# Patient Record
Sex: Male | Born: 1940 | Race: White | Hispanic: No | Marital: Married | Smoking: Former smoker
Health system: Southern US, Community
[De-identification: ages and names within clinical notes are randomized; demographics above are authoritative.]

## PROBLEM LIST (undated history)

## (undated) DIAGNOSIS — N400 Enlarged prostate without lower urinary tract symptoms: Secondary | ICD-10-CM

## (undated) DIAGNOSIS — E785 Hyperlipidemia, unspecified: Secondary | ICD-10-CM

## (undated) DIAGNOSIS — G4730 Sleep apnea, unspecified: Secondary | ICD-10-CM

## (undated) DIAGNOSIS — J309 Allergic rhinitis, unspecified: Secondary | ICD-10-CM

## (undated) HISTORY — DX: Allergic rhinitis, unspecified: J30.9

## (undated) HISTORY — DX: Benign prostatic hyperplasia without lower urinary tract symptoms: N40.0

## (undated) HISTORY — PX: COLONOSCOPY: SHX174

## (undated) HISTORY — DX: Sleep apnea, unspecified: G47.30

## (undated) HISTORY — DX: Hyperlipidemia, unspecified: E78.5

---

## 1997-12-10 HISTORY — PX: PROSTATE BIOPSY: SHX241

## 1999-05-22 ENCOUNTER — Other Ambulatory Visit: Admission: RE | Admit: 1999-05-22 | Discharge: 1999-05-22 | Payer: Self-pay | Admitting: Urology

## 1999-12-11 HISTORY — PX: TOTAL KNEE ARTHROPLASTY: SHX125

## 2000-04-25 ENCOUNTER — Emergency Department (HOSPITAL_COMMUNITY): Admission: EM | Admit: 2000-04-25 | Discharge: 2000-04-25 | Payer: Self-pay | Admitting: Emergency Medicine

## 2000-06-17 ENCOUNTER — Encounter: Payer: Self-pay | Admitting: Orthopedic Surgery

## 2000-06-26 ENCOUNTER — Inpatient Hospital Stay (HOSPITAL_COMMUNITY): Admission: RE | Admit: 2000-06-26 | Discharge: 2000-07-01 | Payer: Self-pay | Admitting: Orthopedic Surgery

## 2000-06-28 ENCOUNTER — Encounter: Payer: Self-pay | Admitting: Orthopedic Surgery

## 2004-07-31 ENCOUNTER — Encounter: Payer: Self-pay | Admitting: Internal Medicine

## 2005-02-28 ENCOUNTER — Ambulatory Visit: Payer: Self-pay | Admitting: Internal Medicine

## 2005-05-18 ENCOUNTER — Ambulatory Visit: Payer: Self-pay | Admitting: Internal Medicine

## 2005-07-23 ENCOUNTER — Ambulatory Visit: Payer: Self-pay | Admitting: Internal Medicine

## 2006-01-24 ENCOUNTER — Ambulatory Visit: Payer: Self-pay | Admitting: Internal Medicine

## 2006-02-07 ENCOUNTER — Ambulatory Visit: Payer: Self-pay | Admitting: Internal Medicine

## 2006-02-12 ENCOUNTER — Encounter: Payer: Self-pay | Admitting: Internal Medicine

## 2006-02-12 ENCOUNTER — Ambulatory Visit (HOSPITAL_BASED_OUTPATIENT_CLINIC_OR_DEPARTMENT_OTHER): Admission: RE | Admit: 2006-02-12 | Discharge: 2006-02-12 | Payer: Self-pay | Admitting: Internal Medicine

## 2006-02-12 ENCOUNTER — Encounter: Payer: Self-pay | Admitting: Pulmonary Disease

## 2006-02-26 ENCOUNTER — Ambulatory Visit: Payer: Self-pay | Admitting: Pulmonary Disease

## 2006-08-07 ENCOUNTER — Ambulatory Visit: Payer: Self-pay | Admitting: Internal Medicine

## 2007-10-31 ENCOUNTER — Ambulatory Visit: Payer: Self-pay | Admitting: Family Medicine

## 2007-11-24 ENCOUNTER — Ambulatory Visit: Payer: Self-pay | Admitting: Internal Medicine

## 2007-11-24 LAB — CONVERTED CEMR LAB
ALT: 16 units/L (ref 0–53)
Basophils Relative: 0.3 % (ref 0.0–1.0)
Bilirubin, Direct: 0.2 mg/dL (ref 0.0–0.3)
CO2: 31 meq/L (ref 19–32)
Calcium: 9.3 mg/dL (ref 8.4–10.5)
Creatinine, Ser: 1 mg/dL (ref 0.4–1.5)
Eosinophils Relative: 4.2 % (ref 0.0–5.0)
GFR calc Af Amer: 97 mL/min
Glucose, Bld: 97 mg/dL (ref 70–99)
Hemoglobin: 13.7 g/dL (ref 13.0–17.0)
Lymphocytes Relative: 26.9 % (ref 12.0–46.0)
Monocytes Absolute: 0.5 10*3/uL (ref 0.2–0.7)
Neutro Abs: 3.7 10*3/uL (ref 1.4–7.7)
Nitrite: NEGATIVE
Potassium: 4.4 meq/L (ref 3.5–5.1)
RDW: 13.1 % (ref 11.5–14.6)
TSH: 2.41 microintl units/mL (ref 0.35–5.50)
Total Bilirubin: 0.8 mg/dL (ref 0.3–1.2)
Total Protein: 6 g/dL (ref 6.0–8.3)
Urobilinogen, UA: NEGATIVE
VLDL: 17 mg/dL (ref 0–40)
WBC Urine, dipstick: NEGATIVE
WBC: 6.1 10*3/uL (ref 4.5–10.5)

## 2007-11-26 ENCOUNTER — Ambulatory Visit: Payer: Self-pay | Admitting: Internal Medicine

## 2007-11-26 DIAGNOSIS — Z87898 Personal history of other specified conditions: Secondary | ICD-10-CM | POA: Insufficient documentation

## 2007-11-26 DIAGNOSIS — J309 Allergic rhinitis, unspecified: Secondary | ICD-10-CM | POA: Insufficient documentation

## 2007-11-26 DIAGNOSIS — Z96659 Presence of unspecified artificial knee joint: Secondary | ICD-10-CM | POA: Insufficient documentation

## 2007-12-08 ENCOUNTER — Ambulatory Visit: Payer: Self-pay | Admitting: Internal Medicine

## 2007-12-12 ENCOUNTER — Encounter (INDEPENDENT_AMBULATORY_CARE_PROVIDER_SITE_OTHER): Payer: Self-pay | Admitting: *Deleted

## 2008-01-08 ENCOUNTER — Ambulatory Visit: Payer: Self-pay | Admitting: Internal Medicine

## 2008-01-21 ENCOUNTER — Encounter (INDEPENDENT_AMBULATORY_CARE_PROVIDER_SITE_OTHER): Payer: Self-pay | Admitting: *Deleted

## 2008-01-21 LAB — CONVERTED CEMR LAB
Albumin: 3.6 g/dL (ref 3.5–5.2)
Alkaline Phosphatase: 47 units/L (ref 39–117)
Total Bilirubin: 1.1 mg/dL (ref 0.3–1.2)
Total CHOL/HDL Ratio: 4.4
Total CK: 69 units/L (ref 7–195)
Triglycerides: 73 mg/dL (ref 0–149)
VLDL: 15 mg/dL (ref 0–40)

## 2008-02-24 ENCOUNTER — Ambulatory Visit: Payer: Self-pay | Admitting: Internal Medicine

## 2008-02-24 DIAGNOSIS — E785 Hyperlipidemia, unspecified: Secondary | ICD-10-CM | POA: Insufficient documentation

## 2008-02-24 LAB — CONVERTED CEMR LAB
Cholesterol, target level: 200 mg/dL
HDL goal, serum: 40 mg/dL
LDL Goal: 160 mg/dL

## 2008-05-25 ENCOUNTER — Ambulatory Visit: Payer: Self-pay | Admitting: Internal Medicine

## 2008-06-01 ENCOUNTER — Encounter: Payer: Self-pay | Admitting: Internal Medicine

## 2008-06-06 LAB — CONVERTED CEMR LAB
ALT: 15 units/L (ref 0–53)
AST: 18 units/L (ref 0–37)
Albumin: 3.7 g/dL (ref 3.5–5.2)
Alkaline Phosphatase: 52 units/L (ref 39–117)
Bilirubin, Direct: 0.1 mg/dL (ref 0.0–0.3)
Cholesterol: 178 mg/dL (ref 0–200)
Total Protein: 6.6 g/dL (ref 6.0–8.3)
Triglycerides: 79 mg/dL (ref 0–149)

## 2008-06-08 ENCOUNTER — Encounter (INDEPENDENT_AMBULATORY_CARE_PROVIDER_SITE_OTHER): Payer: Self-pay | Admitting: *Deleted

## 2008-07-15 ENCOUNTER — Telehealth (INDEPENDENT_AMBULATORY_CARE_PROVIDER_SITE_OTHER): Payer: Self-pay | Admitting: *Deleted

## 2008-07-16 ENCOUNTER — Ambulatory Visit: Payer: Self-pay | Admitting: Internal Medicine

## 2008-07-19 ENCOUNTER — Encounter (INDEPENDENT_AMBULATORY_CARE_PROVIDER_SITE_OTHER): Payer: Self-pay | Admitting: *Deleted

## 2008-07-19 LAB — CONVERTED CEMR LAB
AST: 24 units/L (ref 0–37)
Albumin: 3.9 g/dL (ref 3.5–5.2)
Alkaline Phosphatase: 53 units/L (ref 39–117)
Basophils Absolute: 0 10*3/uL (ref 0.0–0.1)
Basophils Relative: 0.6 % (ref 0.0–3.0)
Eosinophils Absolute: 0.1 10*3/uL (ref 0.0–0.7)
Lymphocytes Relative: 27 % (ref 12.0–46.0)
MCHC: 33.7 g/dL (ref 30.0–36.0)
MCV: 91.6 fL (ref 78.0–100.0)
Neutrophils Relative %: 56.5 % (ref 43.0–77.0)
RBC: 4.73 M/uL (ref 4.22–5.81)
RDW: 13.1 % (ref 11.5–14.6)
TSH: 2.38 microintl units/mL (ref 0.35–5.50)
Total Bilirubin: 1 mg/dL (ref 0.3–1.2)
Total CK: 130 units/L (ref 7–195)

## 2008-07-25 LAB — CONVERTED CEMR LAB: Vit D, 1,25-Dihydroxy: 30 (ref 30–89)

## 2008-07-26 ENCOUNTER — Encounter (INDEPENDENT_AMBULATORY_CARE_PROVIDER_SITE_OTHER): Payer: Self-pay | Admitting: *Deleted

## 2008-10-13 ENCOUNTER — Ambulatory Visit: Payer: Self-pay | Admitting: Family Medicine

## 2008-10-15 ENCOUNTER — Telehealth (INDEPENDENT_AMBULATORY_CARE_PROVIDER_SITE_OTHER): Payer: Self-pay | Admitting: *Deleted

## 2008-10-16 ENCOUNTER — Ambulatory Visit: Payer: Self-pay | Admitting: Family Medicine

## 2008-10-27 ENCOUNTER — Encounter (INDEPENDENT_AMBULATORY_CARE_PROVIDER_SITE_OTHER): Payer: Self-pay | Admitting: *Deleted

## 2008-12-22 ENCOUNTER — Encounter: Payer: Self-pay | Admitting: Internal Medicine

## 2008-12-26 ENCOUNTER — Encounter: Payer: Self-pay | Admitting: Internal Medicine

## 2008-12-27 ENCOUNTER — Telehealth (INDEPENDENT_AMBULATORY_CARE_PROVIDER_SITE_OTHER): Payer: Self-pay | Admitting: *Deleted

## 2008-12-29 ENCOUNTER — Telehealth (INDEPENDENT_AMBULATORY_CARE_PROVIDER_SITE_OTHER): Payer: Self-pay | Admitting: *Deleted

## 2009-01-04 ENCOUNTER — Ambulatory Visit: Payer: Self-pay | Admitting: Pulmonary Disease

## 2009-01-26 ENCOUNTER — Encounter: Payer: Self-pay | Admitting: Pulmonary Disease

## 2009-02-15 ENCOUNTER — Telehealth (INDEPENDENT_AMBULATORY_CARE_PROVIDER_SITE_OTHER): Payer: Self-pay | Admitting: *Deleted

## 2009-02-16 ENCOUNTER — Encounter: Payer: Self-pay | Admitting: Pulmonary Disease

## 2009-05-13 ENCOUNTER — Ambulatory Visit: Payer: Self-pay | Admitting: Family Medicine

## 2009-05-25 ENCOUNTER — Ambulatory Visit: Payer: Self-pay | Admitting: Internal Medicine

## 2009-06-01 ENCOUNTER — Telehealth (INDEPENDENT_AMBULATORY_CARE_PROVIDER_SITE_OTHER): Payer: Self-pay | Admitting: *Deleted

## 2009-06-01 ENCOUNTER — Encounter (INDEPENDENT_AMBULATORY_CARE_PROVIDER_SITE_OTHER): Payer: Self-pay | Admitting: *Deleted

## 2009-06-28 ENCOUNTER — Encounter: Payer: Self-pay | Admitting: Internal Medicine

## 2009-07-11 ENCOUNTER — Ambulatory Visit: Payer: Self-pay | Admitting: Pulmonary Disease

## 2009-08-16 ENCOUNTER — Ambulatory Visit: Payer: Self-pay | Admitting: Internal Medicine

## 2009-08-21 LAB — CONVERTED CEMR LAB
Cholesterol: 166 mg/dL (ref 0–200)
LDL Cholesterol: 110 mg/dL — ABNORMAL HIGH (ref 0–99)

## 2009-08-22 ENCOUNTER — Encounter (INDEPENDENT_AMBULATORY_CARE_PROVIDER_SITE_OTHER): Payer: Self-pay | Admitting: *Deleted

## 2009-09-13 ENCOUNTER — Ambulatory Visit: Payer: Self-pay | Admitting: Internal Medicine

## 2009-12-29 ENCOUNTER — Encounter: Payer: Self-pay | Admitting: Pulmonary Disease

## 2010-06-20 ENCOUNTER — Ambulatory Visit: Payer: Self-pay | Admitting: Internal Medicine

## 2010-06-20 DIAGNOSIS — R079 Chest pain, unspecified: Secondary | ICD-10-CM | POA: Insufficient documentation

## 2010-06-20 DIAGNOSIS — D179 Benign lipomatous neoplasm, unspecified: Secondary | ICD-10-CM | POA: Insufficient documentation

## 2010-06-21 ENCOUNTER — Encounter: Payer: Self-pay | Admitting: Internal Medicine

## 2010-06-27 ENCOUNTER — Ambulatory Visit: Payer: Self-pay | Admitting: Internal Medicine

## 2010-07-10 ENCOUNTER — Ambulatory Visit: Payer: Self-pay | Admitting: Pulmonary Disease

## 2010-07-10 DIAGNOSIS — G4733 Obstructive sleep apnea (adult) (pediatric): Secondary | ICD-10-CM | POA: Insufficient documentation

## 2010-07-11 ENCOUNTER — Ambulatory Visit: Payer: Self-pay | Admitting: Internal Medicine

## 2010-07-21 ENCOUNTER — Ambulatory Visit: Payer: Self-pay

## 2010-07-21 ENCOUNTER — Ambulatory Visit: Payer: Self-pay | Admitting: Cardiology

## 2010-10-30 ENCOUNTER — Encounter: Payer: Self-pay | Admitting: Internal Medicine

## 2010-11-02 ENCOUNTER — Encounter: Payer: Self-pay | Admitting: Pulmonary Disease

## 2010-11-25 ENCOUNTER — Emergency Department (HOSPITAL_COMMUNITY)
Admission: EM | Admit: 2010-11-25 | Discharge: 2010-11-26 | Payer: Self-pay | Source: Home / Self Care | Admitting: Emergency Medicine

## 2010-11-27 ENCOUNTER — Telehealth: Payer: Self-pay | Admitting: Internal Medicine

## 2010-11-27 DIAGNOSIS — K828 Other specified diseases of gallbladder: Secondary | ICD-10-CM | POA: Insufficient documentation

## 2010-11-27 DIAGNOSIS — R109 Unspecified abdominal pain: Secondary | ICD-10-CM | POA: Insufficient documentation

## 2010-12-06 ENCOUNTER — Ambulatory Visit (HOSPITAL_COMMUNITY)
Admission: RE | Admit: 2010-12-06 | Discharge: 2010-12-06 | Payer: Self-pay | Source: Home / Self Care | Attending: Internal Medicine | Admitting: Internal Medicine

## 2010-12-07 ENCOUNTER — Telehealth (INDEPENDENT_AMBULATORY_CARE_PROVIDER_SITE_OTHER): Payer: Self-pay | Admitting: *Deleted

## 2010-12-10 HISTORY — PX: CHOLECYSTECTOMY: SHX55

## 2010-12-25 ENCOUNTER — Encounter: Payer: Self-pay | Admitting: Internal Medicine

## 2011-01-02 ENCOUNTER — Telehealth (INDEPENDENT_AMBULATORY_CARE_PROVIDER_SITE_OTHER): Payer: Self-pay | Admitting: *Deleted

## 2011-01-03 LAB — COMPREHENSIVE METABOLIC PANEL
ALT: 16 U/L (ref 0–53)
Alkaline Phosphatase: 55 U/L (ref 39–117)
CO2: 29 mEq/L (ref 19–32)
Calcium: 9.5 mg/dL (ref 8.4–10.5)
GFR calc non Af Amer: >60 mL/min (ref >60)
Glucose, Bld: 90 mg/dL (ref 70–99)
Potassium: 4.8 mEq/L (ref 3.5–5.1)
Sodium: 142 mEq/L (ref 135–145)
Total Bilirubin: 1.1 mg/dL (ref 0.3–1.2)

## 2011-01-03 LAB — CBC
HCT: 44.9 % (ref 39.0–52.0)
MCHC: 33.9 g/dL (ref 30.0–36.0)
MCV: 90 fL (ref 78.0–100.0)
RDW: 13.2 % (ref 11.5–15.5)
WBC: 7.6 10*3/uL (ref 4.0–10.5)

## 2011-01-03 LAB — DIFFERENTIAL
Eosinophils Relative: 3 % (ref 0–5)
Lymphocytes Relative: 23 % (ref 12–46)
Lymphs Abs: 1.8 10*3/uL (ref 0.7–4.0)
Monocytes Absolute: 0.7 10*3/uL (ref 0.1–1.0)
Monocytes Relative: 9 % (ref 3–12)

## 2011-01-03 LAB — SURGICAL PCR SCREEN: Staphylococcus aureus: POSITIVE — AB

## 2011-01-03 LAB — URINALYSIS, ROUTINE W REFLEX MICROSCOPIC
Bilirubin Urine: NEGATIVE
Hgb urine dipstick: NEGATIVE
Specific Gravity, Urine: 1.01 (ref 1.005–1.030)
Urobilinogen, UA: 0.2 mg/dL (ref 0.0–1.0)

## 2011-01-07 LAB — CONVERTED CEMR LAB
Albumin: 3.7 g/dL (ref 3.5–5.2)
Alkaline Phosphatase: 52 units/L (ref 39–117)
BUN: 16 mg/dL (ref 6–23)
Basophils Absolute: 0 10*3/uL (ref 0.0–0.1)
Basophils Absolute: 0 10*3/uL (ref 0.0–0.1)
Bilirubin Urine: NEGATIVE
Bilirubin, Direct: 0.2 mg/dL (ref 0.0–0.3)
Blood in Urine, dipstick: NEGATIVE
CO2: 29 meq/L (ref 19–32)
CO2: 32 meq/L (ref 19–32)
Calcium: 9.3 mg/dL (ref 8.4–10.5)
Cholesterol: 211 mg/dL — ABNORMAL HIGH (ref 0–200)
Creatinine, Ser: 0.9 mg/dL (ref 0.4–1.5)
Direct LDL: 137.7 mg/dL
Direct LDL: 139 mg/dL
Eosinophils Absolute: 0.1 10*3/uL (ref 0.0–0.7)
Eosinophils Absolute: 0.1 10*3/uL (ref 0.0–0.7)
Glucose, Bld: 80 mg/dL (ref 70–99)
Glucose, Bld: 90 mg/dL (ref 70–99)
HCT: 39.8 % (ref 39.0–52.0)
HDL: 53.5 mg/dL (ref >39.00)
Hemoglobin: 13.9 g/dL (ref 13.0–17.0)
LDL Goal: 85 mg/dL
Lymphocytes Relative: 29.8 % (ref 12.0–46.0)
Lymphs Abs: 1.5 10*3/uL (ref 0.7–4.0)
MCHC: 34.2 g/dL (ref 30.0–36.0)
MCHC: 34.9 g/dL (ref 30.0–36.0)
Monocytes Absolute: 0.7 10*3/uL (ref 0.1–1.0)
Neutro Abs: 7.5 10*3/uL (ref 1.4–7.7)
Neutrophils Relative %: 58.1 % (ref 43.0–77.0)
PSA: 2.17 ng/mL (ref 0.10–4.00)
Platelets: 172 10*3/uL (ref 150.0–400.0)
Potassium: 3.7 meq/L (ref 3.5–5.1)
RBC: 4.53 M/uL (ref 4.22–5.81)
RDW: 13.3 % (ref 11.5–14.6)
RDW: 14.4 % (ref 11.5–14.6)
Sodium: 144 meq/L (ref 135–145)
TSH: 1.36 microintl units/mL (ref 0.35–5.50)
Total Bilirubin: 1 mg/dL (ref 0.3–1.2)
Total CHOL/HDL Ratio: 4
Triglycerides: 71 mg/dL (ref 0.0–149.0)
Triglycerides: 92 mg/dL (ref 0.0–149.0)
Urobilinogen, UA: 0.2

## 2011-01-09 ENCOUNTER — Ambulatory Visit (HOSPITAL_COMMUNITY)
Admission: RE | Admit: 2011-01-09 | Discharge: 2011-01-10 | Disposition: A | Discharge status: Home / Self Care | Payer: Medicare Other | Attending: General Surgery | Admitting: General Surgery

## 2011-01-09 ENCOUNTER — Other Ambulatory Visit: Payer: Self-pay | Admitting: General Surgery

## 2011-01-09 DIAGNOSIS — Z23 Encounter for immunization: Secondary | ICD-10-CM | POA: Insufficient documentation

## 2011-01-09 DIAGNOSIS — K811 Chronic cholecystitis: Secondary | ICD-10-CM | POA: Insufficient documentation

## 2011-01-09 DIAGNOSIS — E785 Hyperlipidemia, unspecified: Secondary | ICD-10-CM | POA: Insufficient documentation

## 2011-01-09 DIAGNOSIS — Z96659 Presence of unspecified artificial knee joint: Secondary | ICD-10-CM | POA: Insufficient documentation

## 2011-01-09 DIAGNOSIS — Z79899 Other long term (current) drug therapy: Secondary | ICD-10-CM | POA: Insufficient documentation

## 2011-01-09 DIAGNOSIS — G4733 Obstructive sleep apnea (adult) (pediatric): Secondary | ICD-10-CM | POA: Insufficient documentation

## 2011-01-09 NOTE — Letter (Signed)
Summary: [facility]  [facility]   Imported By: Lanelle Bal 11/10/2010 09:52:42  _____________________________________________________________________  External Attachment:    Type:   Image     Comment:   External Document

## 2011-01-09 NOTE — Letter (Signed)
Summary: CMN for CPAP Supplies/[facility]  CMN for CPAP Supplies/[facility]   Imported By: Sherian Rein 11/10/2010 08:22:37  _____________________________________________________________________  External Attachment:    Type:   Image     Comment:   External Document

## 2011-01-09 NOTE — Letter (Signed)
Summary: [facility]  [facility]   Imported By: Lanelle Bal 07/06/2010 13:24:19  _____________________________________________________________________  External Attachment:    Type:   Image     Comment:   External Document

## 2011-01-09 NOTE — Assessment & Plan Note (Signed)
Summary: shingles vac/cbs  Nurse Visit   Allergies: 1)  ! Crestor (Rosuvastatin Calcium)  Immunizations Administered:  Zostavax # 1:    Vaccine Type: Zostavax    Site: Right Arm    Mfr: Merck    Dose: 0.82mL    Route: [place]    Given by: Shonna Chock CMA    Exp. Date: 07/27/2011    Lot #: [id number]    VIS given: 09/21/05 given July 11, 2010.  Orders Added: 1)  Zoster (Shingles) Vaccine Live [90736] 2)  Admin 1st Vaccine [phone]

## 2011-01-09 NOTE — Letter (Signed)
Summary: CMN for CPAP Supplies/[facility]  CMN for CPAP Supplies/[facility]   Imported By: Sherian Rein 01/05/2010 08:23:32  _____________________________________________________________________  External Attachment:    Type:   Image     Comment:   External Document

## 2011-01-09 NOTE — Assessment & Plan Note (Signed)
Summary: PNEUMONIA SHOT/CDJ  Nurse Visit   Allergies: 1)  ! Crestor (Rosuvastatin Calcium)  Immunizations Administered:  Pneumonia Vaccine:    Vaccine Type: Pneumovax (Medicare)    Site: left deltoid    Mfr: Merck    Dose: 0.5 ml    Route: IM    Exp. Date: 12/27/2011    Lot #: [id number]  Orders Added: 1)  Pneumococcal Vaccine [90732] 2)  Admin 1st Vaccine [87564]

## 2011-01-09 NOTE — Op Note (Signed)
[name], [name] NO.:  [id number]  MEDICAL RECORD NO.:  [id number]          PATIENT TYPE:  OIB  LOCATION:  0098                         FACILITY:  [facility]  PHYSICIAN:  Angelia Mould. Derrell Lolling, M.D.DATE OF BIRTH:  04/17/1941  DATE OF PROCEDURE:  01/09/2011 DATE OF DISCHARGE:                              OPERATIVE REPORT   PREOPERATIVE DIAGNOSIS:  Chronic cholecystitis.  POSTOPERATIVE DIAGNOSIS:  Chronic cholecystitis.  OPERATION PERFORMED:  Laparoscopic cholecystectomy with intraoperative cholangiogram.  SURGEON:  Claud Kelp, M.D.  FIRST ASSISTANT:  Anselm Pancoast. Zachery Dakins, M.D.  OPERATIVE INDICATIONS:  This is a 70 year old Caucasian gentleman with sleep apnea, hyperlipidemia, and benign prostatic hypertrophy.  For the last few months, he had been having some episodes of reflux and bloating easily after he eats a heavy meal.  In December, he had an episode of epigastric and right upper quadrant pain radiating to the right flank with mild nausea, and he went to the emergency room.  Cardiac disease was ruled out.  Lab work was normal.  Ultrasound showed tiny immobile foci, considerations being polyps or adenomyosis.  He had a subsequent biliary scan which showed a depressed ejection fraction of 16%. Subsequent to this, he only has minor discomfort.  He was referred to me for consideration of cholecystectomy.  It is notable that his father had gallstones as well.  I felt that his symptom complex was fairly consistent with biliary tract disease, and although it was unclear whether he really had stones or not, he was offered the option of cholecystectomy.  He decided to go ahead and have that done, and he is brought to the operating room electively.  OPERATIVE FINDINGS:  The gallbladder was thin-walled and was of a fairly normal color.  There were extensive chronic adhesions to the lower body and infundibulum which took at least 10-15 minutes to take  down. This suggested prior inflammatory episodes.  The cholangiogram was normal showing normal intrahepatic and extrahepatic biliary anatomy, a long cystic duct, no filling defects, and no obstruction with good flow of contrast into the duodenum.  The stomach, duodenum, large intestine, and small intestine all looked normal to inspection.  OPERATIVE TECHNIQUE:  Following the induction of general endotracheal anesthesia, the patient's abdomen was prepped and draped in a sterile fashion.  Surgical time-out was held identifying the correct patient and correct procedure.  Intravenous antibiotics were given.  The 0.5% Marcaine with epinephrine was used as local infiltration anesthetic.  A transverse curved incision was made at the superior rim of the umbilicus.  The fascia was incised in the midline and the abdominal cavity entered under direct vision.  An 11-mm Hassan trocar was inserted and secured with a pursestring suture of 0 Vicryl.  Pneumoperitoneum was created.  Video cam was inserted with findings as described above.  An 11-mm trocar was placed in the subxiphoid region and two 5-mm trocars were placed in the right upper quadrant.  The gallbladder fundus was identified and elevated.  We spent about 10 minutes taking down all the adhesions to the lower body of the gallbladder and the infundibulum.  We slowly dissected out the  cystic duct and the cystic artery.  We actually found the cystic artery branched into an anterior and posterior branch. We spent some time dissecting that out and securing them with metal clips and dividing them as they went onto the gallbladder.  This helped open up a large window and critical view behind the cystic duct. A cholangiogram catheter was inserted into the cystic duct, and a cholangiogram was obtained using the C-arm.  The cholangiogram was normal as described above.  We then removed the cholangiogram catheter, secured the cystic duct with 3 or 4  metal clips and divided it.  The gallbladder dissected from its bed with electrocautery, placed in the specimen bag and removed.  The operative field was irrigated with saline.  Hemostasis was excellent and achieved electrocautery.  We removed all the irrigation fluid.  Then we closed the pursestring suture of 0 Vicryl at the umbilicus.  There was still a little bit of blood dripping down into the peritoneal cavity coming from the umbilical closure.  We then used an Endo Close device and placed a figure-of-eight suture of 0 Vicryl and then tied that down into the fascia and that stopped the bleeding.  This was observed for 5 minutes, and there is no more bleeding.  We evacuated the small amount of blood  that had dripped off of the umbilicus onto the omentum.  The right upper quadrant trocars and epigastric trocars were removed and there was no bleeding from the trocars.  All the skin incisions were closed with subcuticular sutures of 4-0 Monocryl and Dermabond.  Clean bandages were placed, and the patient taken to recovery in stable condition.  Estimated blood loss was about 20 mL. Complications none.  Sponge, needles, and instrument counts werecorrect.     Angelia Mould. Derrell Lolling, M.D.     HMI/MEDQ  D:  01/09/2011  T:  01/09/2011  Job:  [id number]  cc:   Titus Dubin. Alwyn Ren, MD,FACP,FCCP [id number] [place] [address]  Electronically Signed by Claud Kelp M.D. on 01/09/2011 05:51:33 PM
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
## 2011-01-09 NOTE — Assessment & Plan Note (Signed)
Summary: rov for osa   Primary Provider/Referring Provider:  Dr Marga Melnick  CC:  Pt is here for a 1 yr f/u appt on his OSA.  Pt states he is wearing his cpap macine every night.  Approx 7 hours per night.  Pt would like to discuss getting new cpap machine.  Pt denied any complaints with mask or pressure.  Marland Kitchen  History of Present Illness: The pt comes in today for f/u of his osa.  He is doing well with the device, and wears compliantly.  He has no issues with mask fit or pressure.  He feels he is sleeping well, and is satisfied with his daytime alertness.  His weight is up 3 pounds since the last visit.  He is asking about getting a new machine, and will have to check on the age thru his dme.  Medications Prior to Update: 1)  Allegra 180 Mg  Tabs (Fexofenadine Hcl) .... Take 1 Tablet By Mouth Once A Day As Needed 2)  Allergy Vaccine .... Once Weekly 3)  Cpap .Marland Kitchen.. 10 Cwp 4)  Welchol 3.75 Gm Pack (Colesevelam Hcl) .Marland Kitchen.. 1 Pack Daily in 8 Oz Water @ Mealtime 5)  Zetia 10 Mg Tabs (Ezetimibe) .Marland Kitchen.. 1 Once Daily With Eve Mael.  Allergies (verified): 1)  ! Crestor (Rosuvastatin Calcium)  Review of Systems  The patient denies shortness of breath with activity, shortness of breath at rest, productive cough, non-productive cough, coughing up blood, chest pain, irregular heartbeats, acid heartburn, indigestion, loss of appetite, weight change, abdominal pain, difficulty swallowing, sore throat, tooth/dental problems, headaches, nasal congestion/difficulty breathing through nose, sneezing, itching, ear ache, anxiety, depression, hand/feet swelling, joint stiffness or pain, rash, change in color of mucus, and fever.    Vital Signs:  Patient profile:   70 year old male Height:      71.5 inches Weight:      200.38 pounds BMI:     27.66 O2 Sat:      98 % on Room air Temp:     98.2 degrees F oral Pulse rate:   66 / minute BP sitting:   112 / 68  (right arm) Cuff size:   regular  Vitals Entered By:  Arman Filter LPN (July 10, 2010 10:10 AM)  O2 Flow:  Room air CC: Pt is here for a 1 yr f/u appt on his OSA.  Pt states he is wearing his cpap macine every night.  Approx 7 hours per night.  Pt would like to discuss getting new cpap machine.  Pt denied any complaints with mask or pressure.   Comments Medications reviewed with patient Arman Filter LPN  July 10, 2010 10:12 AM    Physical Exam  General:  wd male in nad Nose:  no skin breakdown or pressure necrosis from the cpap mask Extremities:  no edema or cyanosis Neurologic:  alert and oriented, not sleepy, moves all 4.   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  Will check with dme about getting him a new cpap machine, and I have reminded him to keep up with supplies and mask exchanges.  I have also asked him to work on modest weight loss.  Other Orders: Est. Patient Level III (16109) DME Referral (DME)  Patient Instructions: 1)  will see about getting you a new cpap machine 2)  followup with me  in one year.

## 2011-01-11 NOTE — Progress Notes (Signed)
Summary: Records Request  Faxed Stress to [facility] at [facility] ([phone]). Debby Freiberg  January 02, 2011 2:42 PM

## 2011-01-11 NOTE — Progress Notes (Signed)
Summary: gallbladder polyps  Phone Note Call from Patient Call back at Home Phone [phone] Call back at [phone] or [phone]   Caller: Spouse Summary of Call: Patient spouse lm on triage that over the weekend the pt went to the ER b/c he thought he was having a heart attack. It turns out that he was not, but did have gallbladder polyps. Would you like to see the pt for hospital follow up or prefer to refer the pt to [facility]. Please advise. Initial call taken by: Lucious Groves CMA,  November 27, 2010 11:48 AM  Follow-up for Phone Call        I recommend Papida scan to assess GB function then OV Follow-up by: Marga Melnick MD,  November 27, 2010 2:03 PM  Additional Follow-up for Phone Call Additional follow up Details #1::        Discuss with patient appt scheduled..........Marland KitchenFelecia Deloach CMA  November 27, 2010 2:38 PM   New Problems: ABDOMINAL PAIN, UNSPECIFIED SITE (ICD-789.00) POLYP, GALLBLADDER (ICD-575.8)   New Problems: ABDOMINAL PAIN, UNSPECIFIED SITE (ICD-789.00) POLYP, GALLBLADDER (ICD-575.8)

## 2011-01-11 NOTE — Progress Notes (Signed)
Summary: Request for Pain/Nausea Meds  Phone Note Call from Patient   Caller: Patient Call For: Marga Melnick MD Details for Reason: Requesting meds Summary of Call: Call from patient and he stated he is having gallbladder surgery in a few weeks, but is having some pain and nausea sparadically. He wants to know if we could call in something for his pain and nausea to [facility] in the [place]. He is currently not having any issues but wants to be prepared due to the holidays. Allergic to Crestor only per pt summary. c/b # [phone].Marland KitchenMarland KitchenMarland Kitchen please advise Initial call taken by: Almeta Monas CMA Duncan Dull),  December 07, 2010 12:56 PM  Follow-up for Phone Call        per Dr. Alwyn Ren ok to call in Tramadol 50 mg every 6 hours #30 and phenergan 25mg  every 6-8 hours as needed nausea #5...Marland KitchenMarland KitchenMarland Kitchen Pt aware and not allergic Follow-up by: Almeta Monas CMA Duncan Dull),  December 07, 2010 2:39 PM    New/Updated Medications: TRAMADOL HCL 50 MG TABS (TRAMADOL HCL) 1 by mouth every 6 hours as needed PROMETHAZINE HCL 25 MG TABS (PROMETHAZINE HCL) by mouth every 6-8 hours as needed nausea Prescriptions: PROMETHAZINE HCL 25 MG TABS (PROMETHAZINE HCL) by mouth every 6-8 hours as needed nausea  #5 x 0   Entered by:   Almeta Monas CMA (AAMA)   Authorized by:   Marga Melnick MD   Signed by:   Almeta Monas CMA (AAMA) on 12/07/2010   Method used:   Faxed to ...       [facility]. [phone]* (retail)       [phone]       [place], [place]  [address]       Ph: [phone]       Fax: [phone]   RxID:   [id number] TRAMADOL HCL 50 MG TABS (TRAMADOL HCL) 1 by mouth every 6 hours as needed  #30 x 0   Entered by:   Almeta Monas CMA (AAMA)   Authorized by:   Marga Melnick MD   Signed by:   Almeta Monas CMA Duncan Dull) on 12/07/2010   Method used:   Electronically to        [facility]. [phone]* (retail)       [address]  [place], [place]  [address]       Ph: [phone]       Fax: [phone]   RxID:   [id number]

## 2011-01-17 NOTE — Consult Note (Signed)
Summary: [facility]   Imported By: Lanelle Bal 01/10/2011 10:57:11  _____________________________________________________________________  External Attachment:    Type:   Image     Comment:   External Document

## 2011-01-22 ENCOUNTER — Encounter: Payer: Self-pay | Admitting: Internal Medicine

## 2011-02-06 NOTE — Letter (Signed)
Summary: [facility]   Imported By: Maryln Gottron 02/01/2011 08:39:23  _____________________________________________________________________  External Attachment:    Type:   Image     Comment:   External Document

## 2011-02-19 LAB — COMPREHENSIVE METABOLIC PANEL
ALT: 17 U/L (ref 0–53)
Alkaline Phosphatase: 54 U/L (ref 39–117)
CO2: 27 mEq/L (ref 19–32)
Glucose, Bld: 91 mg/dL (ref 70–99)
Potassium: 3.9 mEq/L (ref 3.5–5.1)
Sodium: 135 mEq/L (ref 135–145)
Total Protein: 6.6 g/dL (ref 6.0–8.3)

## 2011-02-19 LAB — POCT CARDIAC MARKERS
CKMB, poc: 2.3 ng/mL (ref 1.0–8.0)
Myoglobin, poc: 56.3 ng/mL (ref 12–200)
Troponin i, poc: <0.05 ng/mL (ref 0.00–0.09)

## 2011-02-19 LAB — DIFFERENTIAL
Basophils Relative: 1 % (ref 0–1)
Eosinophils Absolute: 0.4 10*3/uL (ref 0.0–0.7)
Eosinophils Relative: 6 % — ABNORMAL HIGH (ref 0–5)
Monocytes Relative: 8 % (ref 3–12)
Neutrophils Relative %: 55 % (ref 43–77)

## 2011-02-19 LAB — CBC
HCT: 41.3 % (ref 39.0–52.0)
Hemoglobin: 13.9 g/dL (ref 13.0–17.0)
WBC: 7.3 10*3/uL (ref 4.0–10.5)

## 2011-04-27 NOTE — Discharge Summary (Signed)
[facility]  Patient:    [name], [name]                      MRN: [id number] Adm. Date:  [id number] Disc. Date: [id number] Attending:  Loanne Drilling Dictator:   Della Goo, P.A.                           Discharge Summary  ADMISSION DIAGNOSIS:  Severe tricompartmental arthritis, left knee.  DISCHARGE DIAGNOSES: 1. Severe tricompartmental arthritis, left knee. 2. Postoperative urinary tract infection, treated with oral antibiotics. 3. Mild post-hemorrhagic anemia. 4. Possible early consolidation in right lower lobe of the lung per    postoperative chest x-ray. 5. Postoperative fevers, resolving at discharge.  PROCEDURE:  On June 26, 2000, patient underwent left total knee arthroplasty performed by Dr. Ollen Gross, assisted by Druscilla Brownie. Shela Nevin, P.A., under spinal anesthesia.  CONSULTANTS:  None.  BRIEF HISTORY:  Patient is a 70 year old white male with severe osteoarthritis of the left knee, with bone-on-bone deformity and varus deformity.  He has had pain refractory to nonoperative management and was felt to require surgical intervention.  He was admitted for the procedure as stated above.  BRIEF HOSPITAL COURSE:  Patient tolerated the procedure without difficulties. Postoperatively, neurovascular and motor function of the lower extremities were noted to be intact after the spinal anesthesia had resolved.  Hemovac drain was removed on the first postoperative day and dressing changes were done daily thereafter.  Patient had no drainage of his wound.  He was noted to have mild-to-moderate edema of the wound with minimal erythema.  The edema of the knee was at its greatest on postop day #4 and his CPM machine was held with strict ice and elevation to the knee.  Following 24 hours of this treatment, the edema was significantly decreased and patient had range of motion actively from 0 to 90 degrees.  Patient tolerated physical therapy  well for ambulation and gait training and was allowed weightbearing as tolerated on the operative extremity.  He was ambulating distances as much as 200 feet. Patient received occupational therapy for ADLs and tolerated this without difficulty.  He did have continued spikes in his temperature throughout the hospital stay and a urinalysis did show a urinary tract infection.  Patient was started on Cipro empirically during the hospital stay.  He also had a repeat chest x-ray postoperatively showing a right lower lobe consolidation. Blood cultures were obtained secondary to spike in fever and were negative for growth.  At that time of discharge, patients antibiotic was changed to Levaquin to decrease the risk of elevated pro times, which can happen with Cipro; also, this was felt to be more suitable to cover any problems with his lungs.  He was also treated with incentive spirometry and coughing and deep breathing during the hospital stay and will continue this at home.  Patient was on Coumadin for DVT and PE prophylaxis throughout the hospital stay, with adjustments in his daily doses, monitored and changed by the pharmacist at [facility].  LABORATORY AND X-RAY FINDINGS:  Pertinent laboratory values include hemoglobin and hematocrit on admission to be 14.4 and 42.2, respectively.  Patients hemoglobin dropped to the lowest value of 11.9 with hematocrit 36.1 and no need for blood transfusion.  Coagulation studies were normal on admission and pro time and INR were elevated while on Coumadin appropriately.  Patients urinalysis on June 28, 2000 showed amber color, cloudy appearance, small hemoglobin, small leukocyte esterase, a few epithelial cells, 11-20 rbcs and wbcs, a few bacteria, hyaline casts and calcium oxalate crystals.  Chest x-ray on admission revealed heart to be normal in size and pulmonary vascularity felt to be within normal limits.  Markings were noted to be somewhat  prominent in the bases; however, no active inflammatory changes noted.  Appearance of some calcification in the right paratracheal area suggestive of old granulomatous changes, with final impression of negative chest for active disease.  A repeat chest x-ray on July 20. 2001 showed possible early right lower lobe consolidation.  Other possibility when evaluating the prior study is that there may be a vascular abnormality in the right lower lobe, such as an abnormal draining vein.  Enlarged hilus could be simple artifact related to technique and rotation.  EKG on admission showed normal sinus rhythm, confirmed by cardiologist.  PLAN:  Patient is being discharged to his home with arrangements for home health physical therapy three times per week.  He will continue to have active and passive range of motion as well as strengthening exercises.  He is allowed weightbearing as tolerated with ambulation and will use a walker.  Dressing change will be done daily at home by patient and his family.  He will be allowed to shower.  Patient will resume a regular diet.  He will follow up with Dr. Lequita Halt two weeks from the date of his surgery.  If patient has any further pulmonary symptoms or if fever continues, they have been advised to call the office.  At that time, a repeat chest x-ray will be performed.  He will use his incentive spirometry at home.  Patient has TEDs bilaterally which he will use at home as well.  DISCHARGE MEDICATIONS: 1. Percocet 5 mg, #30, one to two every four to six hours as needed for pain. 2. Robaxin 500 mg, #20, one every eight hours as needed for spasm. 3. Coumadin per pharmacy. 4. Levaquin 500 mg one p.o. q.d. x 7 days.  SPECIAL INSTRUCTIONS:  Pro times will be drawn weekly, with adjustments in dosage made by the pharmacy at [facility].  Patient has been advised to continue ice packs on the knee as needed and especially after physical therapy.  Patient  and/or family will call the office if he has spikes in his fever or any other complications for which they are concerned. DD:  07/01/00 TD:  07/03/00 Job: [id number]

## 2011-04-27 NOTE — H&P (Signed)
[facility]  Patient:    [name], [name]                      MRN: [id number] Adm. Date:  [id number] Attending:  Loanne Drilling Dictator:   Druscilla Brownie Shela Nevin, P.A.                         History and Physical  CHIEF COMPLAINT:  "Pain in my left knee."  HISTORY OF PRESENT ILLNESS:  This 70 year old white male has had continuing progressive pain in his left knee.  The patient has had progressive problems with the knee and this has overall interfered with his day-to-day activity as well as his work activities.  He works at the [employer] and has difficulty doing his daily activities due to the pain in his knee.  He also has extreme difficulty in climbing stairs.  He has had to cut short many of his activities do to the pain in his left knee.  Examination of the left knee shows that he has developed a 5 degree varus deformity with crepitus throughout the entire range of motion.  This is markedly uncomfortable.  He has more tenderness in the medial knee than the lateral.  X-rays of the left knee shows changes that are consistent with severe osteoarthritis with bone-on-bone deformity.  Neurovascular is intact in the lower extremity.  Due to these facts above as well as his failure to respond to conservative care which has included ______ anti-inflammatories.  It was felt that this patient would benefit from surgical intervention with a total knee replacement arthroplasty of the left knee.  PAST MEDICAL HISTORY:  This gentleman really has been in good health throughout his life time.  Dr. Clearance Coots has seen the patient preoperatively and cleared him for surgery.  CURRENT MEDICATIONS:  Vioxx, allergy shots and Claritin.  ALLERGIES:  Tetanus (30 years ago).  He has had recent tetanus shots with no problems.  SOCIAL HISTORY:  The patient neither smokes nor drinks.  He is employed at [employer].  PHYSICAL EXAMINATION:   Noncontributory.  REVIEW OF SYSTEMS:  CNS:  No seizure disorder, paralysis, numbness, or double vision.  RESPIRATORY:  No productive cough, no hemoptysis, no shortness of breath. CARDIOVASCULAR:  No chest pain, no angina, no orthopnea.  GI:  No nausea, vomiting, melanotic stool.  GU:  No discharge, dysuria, or hematuria. MUSCULOSKELETAL:  Primarily as in present illness with his left knee.  PHYSICAL EXAMINATION:  Alert and cooperative, 70 year old, friendly, white male.  VITAL SIGNS:  Blood pressure 108/68, respirations 12, pulse 72.  HEENT:  Normocephalic.  PERLA.  Oropharynx is clear.  CHEST:  Clear to auscultation.  No rhonchi, no rales.  HEART:  Regular rate and rhythm.  No murmurs are heard.  ABDOMEN:  Soft, nontender.  Liver and spleen not felt.  GENITALIA AND RECTAL:  Not done.  Not pertinent to present illness.  EXTREMITIES:  Left knee as in present illness above.  ADMITTING DIAGNOSIS:  Severe tricompartmental arthritis, left knee.  PLAN:  The patient will undergo left knee total replacement arthroplasty.  No blood has been donated to this procedure.  He will not need rehabilitation after his surgery.  He will be home with home physical therapy. DD:  06/26/00 TD:  06/27/00 Job: [id number]

## 2011-04-27 NOTE — H&P (Signed)
[facility]  Patient:    [name], [name]                     MRN: [id number] Adm. Date:  06/26/00 Attending:  Ollen Gross, M.D. Dictator:   Druscilla Brownie. Shela Nevin, P.A.                         History and Physical  DATE OF BIRTH:  06-28-1941  CHIEF COMPLAINT:  Pain in my left knee.  HISTORY OF PRESENT ILLNESS:  This is a 70 year old white male who has had continuing and progressive pain into his left knee.  The patient had progressing problem with the knee and has overall has interferred with his day to day activity as well as his work activities.  He works with [employer] and has difficulty doing his day-to-day activities due to the pain in the knee.  He has extreme difficulty in climbing stairs.  He has had to cut short many of his activities due to pain in the left knee.  Examination of the left knee shows that he has developed a 5 degree varus deformity with crepitus throughout the entire range of motion which is uncomfortable.  He has more tenderness in the medial knee than lateral.  X-rays of the left knee shows perhaps changes  [severe static; unable to complete report] DD:  06/17/00 TD:  06/17/00 Job: [id number]

## 2011-04-27 NOTE — Procedures (Signed)
NAME:  [name], [name] NO.:  [id number]   MEDICAL RECORD NO.:  [id number]          PATIENT TYPE:  OUT   LOCATION:  SLEEP CENTER                 FACILITY:  [facility]   PHYSICIAN:  Marcelyn Bruins, M.D. [facility] DATE OF BIRTH:  1941/01/31   DATE OF STUDY:  02/12/2006                              NOCTURNAL POLYSOMNOGRAM   REFERRING PHYSICIAN:  Dr. Lona Kettle.   DATE OF STUDY:  February 12, 2006.   INDICATION FOR STUDY:  Hypersomnia with sleep apnea.   SLEEP ARCHITECTURE:  The patient had total sleep time of 307 minutes with  adequate slow wave sleep but very decreased REM. Sleep onset latency was  normal as was REM onset. Sleep efficiency was decreased at 76%.   RESPIRATORY DATA:  The patient underwent split night study where he was  found to have 192 obstructive events in the first 149 minutes of sleep. This  gave the patient a respiratory disturbance index of 77 events per hour in  the first half of the night. The events were not positional but there was  loud snoring noted throughout. By protocol, the patient was placed on a  large Respironics full-face mask and C-PAP titration initiated. It was noted  at a final pressure of 18-19 cm, the patient had excellent control of his  obstructive events as well as snoring.   OXYGEN DATA:  There was O2 desaturation as low as 74% with the patient's  obstructive events, especially during REM.   CARDIAC DATA:  No clinically significant cardiac arrhythmias.   MOVEMENT/PARASOMNIAS:  The patient was found to have 100 leg jerks with 1.8  per hour resulting in arousal or awakening.   IMPRESSION/RECOMMENDATIONS:  1.  Severe obstructive sleep apnea/hypopnea syndrome with a respiratory      disturbance index of 77 events per hour prior to C-PAP initiation. The      patient was placed on C-PAP with a large Respironics full-face mask and      titrated to a final pressure of 18-19 cm with excellent control of      events and snoring.  2.   Large numbers of leg jerks with mild sleep disruption. It is unclear      whether this is secondary to the patient's sleep disordered breathing or      whether the patient has a second primary movement disorder. It should be      noted,      however, the patient's leg jerks diminished significantly upon C-PAP      initiation and optimization. Clinical correlation is suggested.           ______________________________  Marcelyn Bruins, M.D. [facility]  Diplomate, American Board of Sleep  Medicine     KC/MEDQ  D:  02/26/2006 13:07:46  T:  02/27/2006 01:45:03  Job:  [id number]

## 2011-04-27 NOTE — Op Note (Signed)
[facility]  Patient:    [name], [name]                      MRN: [id number] Proc. Date: 06/26/00 Adm. Date:  [id number] Attending:  Ollen Gross V                           Operative Report  PREOPERATIVE DIAGNOSIS:  Osteoarthritis, left knee.  POSTOPERATIVE DIAGNOSIS:  Osteoarthritis, left knee.  OPERATION PERFORMED:  Left total knee arthroplasty.  SURGEON:  Ollen Gross, M.D.  ASSISTANT:  Cherly Beach, P.A.  ANESTHESIA:  Spinal.  ESTIMATED BLOOD LOSS:  Minimal.  DRAINS:  Hemovac x 1.  TOURNIQUET TIME:  55 minutes at 350 mmHg.  COMPLICATIONS:  None.  CONDITION:  Stable to recovery.  BRIEF CLINICAL NOTE:  [name] is a 70 year old male with severe osteoarthritis of the left knee with bone on bone and varus deformity. He has had pain refractory to non-operative management and presents now for left total knee arthroplasty.  DESCRIPTION OF PROCEDURE:  After successful administration of spinal anesthetic, a tourniquet was placed high on the left thigh and left lower extremity prepped and draped in the usual sterile fashion. The extremity was wrapped in Esmarch, knee flexed and tourniquet inflated to 350 mmHg. A standard midline incision was made, skin cut with a 10 blade through subcutaneous tissue to the level of the extensor mechanism. A fresh knife was used to made a medial parapatellar arthrotomy in soft tissue at the proximal medial tibia is subperiosteally elevated to the joint line with a knife and into the semimembranosus bursa with a curved osteotome. The soft tissue over the proximal lateral tibia is also elevated with attention being paid to avoiding the patella tendon on the tibial tubercle. The lateral patellofemoral ligaments then cut, patella everted and knee flexed to 90 degrees.  ACL and PCL were then removed. The drill was then used to create a starting hole in the distal femur for placement of the alignment rod. The  canal was irrigated and alignment rod and referencing off the posterior condyles, rotations marked and the distal femoral cutting block pinned so as to remove 9 mm off the distal femur. Distal femoral resection is made and a sizing block is placed and a size 5 is the most appropriate. Rotation is marked utilizing the epicondylar axis and then the cutting block placed to make the anterior and posterior cuts. At this point, the tibia subluxed forward and the menisci removed.  The extramedullary tibial alignment guide was placed referencing proximally at the medial aspect of the tibial tubercle and then distally along the tibial crest and second metatarsal axis. The block is subsequently pinned so as to remove 10 mm off the non-deficient lateral side. Tibial resection is then made, a size 5 is the most appropriate tibial size. At this point, the intercondylar block and the intercondylar and chamfer cuts are made. A size 5 posterior stabilized femoral trial with a size 5 rotation platform tibial tray and a 10 mm rotating platform posterior stabilized trial are placed. Full extension is achieved with excellent varus valgus balance down past 100 degrees of flexion. At this point, the patella is everted, thickness measured to be 27 and free hand resection taken down to 15 mm. A 41 mm template is placed and the lug holes are drilled. A 41 mm trial patella is placed and tracks perfectly.  Proximal tibial preparation is then  performed with the modular drill and keel punch. With the femoral trial in place, the posterior osteophytes are then removed off the femur. At this point, all the trials are removed and the cut bone surfaces are prepared with pulsatile lavage. Cement is mixed and once ready for implantation, the tibia, femur and patella are cemented into place and patella is held with the clamp. A 10 mm rotating trial is placed and knee held in full extension. All extruded cement is removed.  Once the cement is fully hardened, the patella clamp is removed and a permanent 10 mm posterior stabilized rotating platform inserted and placed into the tibial tray. The knee is reduced and once again there is excellent varus and valgus balance. The wound is then copiously irrigated with antibiotic solution and the extensor mechanism closed over 1 limb of the Hemovac drain with interrupted #1 PDS suture. The subcu is closed with interrupted 2-0 Vicryl, subcuticular with running 4-0 monocryl. The incision was cleaned and dried and Steri-Strips and bulky sterile dressing applied. The patient was placed into a knee immobilizer, awakened and transported to recovery in stable condition. DD:  06/26/00 TD:  06/28/00 Job: [id number]

## 2011-06-26 ENCOUNTER — Encounter: Payer: BLUE CROSS/BLUE SHIELD | Admitting: Internal Medicine

## 2011-07-09 ENCOUNTER — Ambulatory Visit: Payer: Self-pay | Admitting: Pulmonary Disease

## 2011-07-17 ENCOUNTER — Ambulatory Visit (INDEPENDENT_AMBULATORY_CARE_PROVIDER_SITE_OTHER): Payer: Medicare Other | Admitting: Internal Medicine

## 2011-07-17 ENCOUNTER — Encounter: Payer: Self-pay | Admitting: Internal Medicine

## 2011-07-17 VITALS — BP 122/78 | HR 75 | Temp 98.1°F | Resp 12 | Ht 72.0 in | Wt 200.2 lb

## 2011-07-17 DIAGNOSIS — E785 Hyperlipidemia, unspecified: Secondary | ICD-10-CM

## 2011-07-17 DIAGNOSIS — Z0000 Encounter for general adult medical examination without abnormal findings: Secondary | ICD-10-CM

## 2011-07-17 DIAGNOSIS — K219 Gastro-esophageal reflux disease without esophagitis: Secondary | ICD-10-CM

## 2011-07-17 DIAGNOSIS — G4733 Obstructive sleep apnea (adult) (pediatric): Secondary | ICD-10-CM

## 2011-07-17 DIAGNOSIS — L259 Unspecified contact dermatitis, unspecified cause: Secondary | ICD-10-CM

## 2011-07-17 DIAGNOSIS — K828 Other specified diseases of gallbladder: Secondary | ICD-10-CM

## 2011-07-17 LAB — LIPID PANEL
Cholesterol: 211 mg/dL — ABNORMAL HIGH (ref 0–200)
HDL: 56.2 mg/dL (ref >39.00)
Triglycerides: 76 mg/dL (ref 0.0–149.0)
VLDL: 15.2 mg/dL (ref 0.0–40.0)

## 2011-07-17 LAB — BASIC METABOLIC PANEL
BUN: 16 mg/dL (ref 6–23)
Calcium: 9.4 mg/dL (ref 8.4–10.5)
Creatinine, Ser: 1 mg/dL (ref 0.4–1.5)
GFR: 79.84 mL/min (ref >60.00)
Glucose, Bld: 96 mg/dL (ref 70–99)

## 2011-07-17 LAB — CBC WITH DIFFERENTIAL/PLATELET
Basophils Absolute: 0 10*3/uL (ref 0.0–0.1)
Eosinophils Relative: 2.5 % (ref 0.0–5.0)
Lymphocytes Relative: 26 % (ref 12.0–46.0)
Lymphs Abs: 1.6 10*3/uL (ref 0.7–4.0)
Monocytes Relative: 8.5 % (ref 3.0–12.0)
Neutrophils Relative %: 62.3 % (ref 43.0–77.0)
Platelets: 164 10*3/uL (ref 150.0–400.0)
RDW: 14.5 % (ref 11.5–14.6)
WBC: 6.2 10*3/uL (ref 4.5–10.5)

## 2011-07-17 LAB — HEPATIC FUNCTION PANEL
ALT: 15 U/L (ref 0–53)
Albumin: 4.1 g/dL (ref 3.5–5.2)
Bilirubin, Direct: 0 mg/dL (ref 0.0–0.3)
Total Protein: 7.1 g/dL (ref 6.0–8.3)

## 2011-07-17 NOTE — Progress Notes (Signed)
Subjective:    Patient ID: [name], male    DOB: [birth year], 70 y.o.   MRN: [id number]  HPI Medicare Wellness Visit:  The following psychosocial & medical history were reviewed as required by Medicare.   Social history: caffeine: 4-5 cups/ day , alcohol:  4 drinks/ week ,  tobacco use : quit 1986  & exercise : walking 7X/ week> 60 min/ day.   Home & personal  safety / fall risk: no issues, activities of daily living: no limitations , seatbelt use : yes , and smoke alarm employment : yes .  Power of Attorney/Living Will status : in place  Vision ( as recorded per Nurse) & Hearing  evaluation :  Hearing recheck pending; wall chart read with lenses . Orientation :oriented X3  , memory & recall :good , spelling  testing: good,and mood & affect : normal  . Depression / anxiety: denied Travel history : [place] recently  , immunization status :up to date , transfusion history:  no, and preventive health surveillance ( colonoscopies, BMD , etc as per protocol/ [facility]): colonoscopy due 2003, Dental care:  Seen every 6 months . Chart reviewed &  Updated. Active issues reviewed & addressed.       Review of Systems Patient reports no  vision changes,anorexia, weight change, fever ,adenopathy, persistant / recurrent hoarseness, swallowing issues, chest pain,palpitations, edema,persistant / recurrent cough, hemoptysis, dyspnea(rest, exertional, paroxysmal nocturnal), gastrointestinal  bleeding (melena, rectal bleeding), abdominal pain, excessive heart burn, GU symptoms( dysuria, hematuria, pyuria, voiding/incontinence  issues) syncope, focal weakness, memory loss,numbness & tingling, skin/hair/nail changes,depression, anxiety, abnormal bruising/bleeding, or  musculoskeletal symptoms/signs except LBP in am which improves with ambulation.      Objective:   Physical Exam Gen.: Then healthy and well-nourished in appearance. Alert, appropriate and cooperative throughout exam. Head: Normocephalic without  obvious abnormalities;  pattern alopecia  Eyes: No corneal or conjunctival inflammation noted. Pupils equal round reactive to light and accommodation. Fundal exam is benign without hemorrhages, exudate, papilledema. Extraocular motion intact. Ears: External  ear exam reveals no significant lesions or deformities. Canals clear .TMs normal.  Nose: External nasal exam reveals no deformity or inflammation. Nasal mucosa are pink and moist. No lesions or exudates noted. Septum  Deviated to R  Mouth: Oral mucosa and oropharynx reveal no lesions or exudates. Teeth in good repair. Neck: No deformities, masses, or tenderness noted. Range of motion &. Thyroid  normal. Lungs: Normal respiratory effort; chest expands symmetrically. Lungs are clear to auscultation without rales, wheezes, or increased work of breathing. Heart: Normal rate and rhythm. Normal S1 and S2. No gallop, click, or rub. S4 murmur. Abdomen: Bowel sounds normal; abdomen soft and nontender. No masses, organomegaly or hernias noted. Genitalia/DRE: Dr Earlene Plater   .                                                                                   Musculoskeletal/extremities: No deformity or scoliosis noted of  the thoracic or lumbar spine. No clubbing, cyanosis, edema, or deformity noted. Range of motion  normal .Tone & strength  normal.Joints normal. Nail health  good. Vascular: Carotid, radial artery, dorsalis pedis and  posterior tibial pulses  are full and equal. No bruits present. Neurologic: Alert and oriented x3. Deep tendon reflexes symmetrical and normal.          Skin: 5X6.5 cm erythematous rash L axillaeLymph: No cervical, axillary lymphadenopathy present. Psych: Mood and affect are normal. Normally interactive                                                                                         Assessment & Plan:  #1 Medicare Wellness Exam; criteria met ; data entered #2 Problem List reviewed ; Assessment/ Recommendations  made #3 Dermatitis  Plan: see Orders

## 2011-07-17 NOTE — Patient Instructions (Signed)
Mix Nizoral with over-the-counter Cortaid; 1 part to  1 part and apply twice a day to the rash. See your  dentist every 6 months; please consider stopping smokeless  tobacco because of the risk of oral and throat cancer.  Your LDL goal will be less than 100 based on the advance cholesterol testing as we discussed.

## 2011-07-23 ENCOUNTER — Telehealth: Payer: Self-pay

## 2011-07-23 MED ORDER — PRAVASTATIN SODIUM 20 MG PO TABS
20.0000 mg | ORAL_TABLET | Freq: Every day | ORAL | Status: DC
Start: 2011-07-23 — End: 2011-07-25

## 2011-07-23 NOTE — Telephone Encounter (Signed)
RX mailed with labs  

## 2011-07-23 NOTE — Telephone Encounter (Signed)
Message copied by Edgardo Roys on Mon Jul 23, 2011  9:34 AM ------      Message from: Pecola Lawless      Created: Sun Jul 22, 2011  2:53 PM       Mildly elevated potassium is not significant in absence of kidney impairment.       Risk of premature heart attack or stroke increases as LDL or BAD cholesterol rises.Advanced cholesterol panels optimally determine risk base on particle composition ( NMR Lipoprofile ).Your LDL goal = < 100, ideally < 70 based on your  NMR panel.Your long term risk is increased almost 40% above goal. Please consider low dose Pravastatin 20 mg daily with recheck of fasting  Labs (Lipids, hepatic panel, CK ; 272.4,995.20) after  10 weeks.This would cost $10 for 90 pills @ [facility] or [facility] if insurance NOT used.All other labs are excellent.[employer]

## 2011-07-25 ENCOUNTER — Ambulatory Visit (INDEPENDENT_AMBULATORY_CARE_PROVIDER_SITE_OTHER): Payer: Medicare Other | Admitting: Pulmonary Disease

## 2011-07-25 ENCOUNTER — Encounter: Payer: Self-pay | Admitting: Pulmonary Disease

## 2011-07-25 VITALS — BP 102/64 | HR 64 | Temp 97.6°F | Ht 71.5 in | Wt 202.8 lb

## 2011-07-25 DIAGNOSIS — G4733 Obstructive sleep apnea (adult) (pediatric): Secondary | ICD-10-CM

## 2011-07-25 NOTE — Patient Instructions (Signed)
Continue with cpap followup with me in one year.

## 2011-07-25 NOTE — Progress Notes (Signed)
  Subjective:    Patient ID: [name], male    DOB: [birth year], 70 y.o.   MRN: [id number]  HPI The patient comes in to day for followup of his known sleep apnea.  He is wearing CPAP compliantly, and has no mask or pressure issues.  He feels that he sleeps well, and is satisfied with his daytime alertness.  His wife has not complained about breakthrough snoring while wearing the device.   Review of Systems  Constitutional: Negative for fever and unexpected weight change.  HENT: Negative for ear pain, nosebleeds, congestion, sore throat, rhinorrhea, sneezing, trouble swallowing, dental problem, postnasal drip and sinus pressure.   Eyes: Negative for redness and itching.  Respiratory: Negative for cough, chest tightness, shortness of breath and wheezing.   Cardiovascular: Negative for palpitations and leg swelling.  Gastrointestinal: Negative for nausea and vomiting.  Genitourinary: Negative for dysuria.  Musculoskeletal: Negative for joint swelling.  Skin: Negative for rash.  Neurological: Negative for headaches.  Hematological: Bruises/bleeds easily.  Psychiatric/Behavioral: Negative for dysphoric mood. The patient is not nervous/anxious.        Objective:   Physical Exam Well-developed male in no acute distress No skin breakdown or pressure necrosis from the CPAP mask Lower extremities without edema, no cyanosis noted Alert, does not appear sleepy, moves all 4 studies.       Assessment & Plan:

## 2011-07-25 NOTE — Assessment & Plan Note (Signed)
The patient is wearing CPAP compliantly, and feels that he is sleeping very well with excellent daytime alertness.  He is not having any mask or pressure issues at this time.  I have asked him to continue with the CPAP, and to followup with me in one year.

## 2011-08-15 ENCOUNTER — Telehealth: Payer: Self-pay | Admitting: *Deleted

## 2011-08-15 ENCOUNTER — Encounter: Payer: Medicare Other | Admitting: Internal Medicine

## 2011-08-15 NOTE — Telephone Encounter (Signed)
Pt left VM stating that he would like to know if he is suppose to being taking both welchol and pravastatin. Please advise

## 2011-08-15 NOTE — Telephone Encounter (Signed)
Left message on voicemail informing patient we received message and will call him as soon as MD advises

## 2011-08-15 NOTE — Telephone Encounter (Signed)
Please  schedule fasting Labs after 10 weeks of Pravastatin WITHOUT Welchol :CK,Lipids, hepatic panel(272.4, 995.200

## 2011-08-16 NOTE — Telephone Encounter (Signed)
Discuss with patient  

## 2011-10-30 ENCOUNTER — Encounter: Payer: Self-pay | Admitting: Internal Medicine

## 2011-10-30 ENCOUNTER — Ambulatory Visit (INDEPENDENT_AMBULATORY_CARE_PROVIDER_SITE_OTHER): Payer: Medicare Other | Admitting: Internal Medicine

## 2011-10-30 VITALS — BP 126/80 | HR 79 | Temp 98.6°F | Wt 197.2 lb

## 2011-10-30 DIAGNOSIS — J029 Acute pharyngitis, unspecified: Secondary | ICD-10-CM

## 2011-10-30 DIAGNOSIS — J069 Acute upper respiratory infection, unspecified: Secondary | ICD-10-CM

## 2011-10-30 NOTE — Progress Notes (Signed)
  Subjective:    Patient ID: [name], male    DOB: Nov 28, 1941, 70 y.o.   MRN: [id number]  HPI  Respiratory tract infection Onset/symptoms:11/14 as headache, congestion Exposures (illness/environmental/extrinsic):no Progression of symptoms:to ST & cough Treatments/response:Mucinex, Allegra , Advil/minimal relief Present symptoms: Fever/chills/sweats:no Frontal headache:no Facial pain:no Nasal purulence:clear Sore throat:yes Dental pain:no Lymphadenopathy:no Wheezing/shortness of breath:no Cough/sputum/hemoptysis:scant clear sputum Associated extrinsic/allergic symptoms:itchy eyes/ sneezing:on allergy shots Smoking history:quit 1986      Review of Systems     Objective:   Physical Exam General appearance is of good health and nourishment; no acute distress or increased work of breathing is present.  No  lymphadenopathy about the head, neck, or axilla noted.   Eyes: No conjunctival inflammation or lid edema is present.   Ears:  External ear exam shows no significant lesions or deformities.  Otoscopic examination reveals clear canals, tympanic membranes are intact bilaterally without bulging, retraction, inflammation or discharge.  Nose:  External nasal examination shows no deformity or inflammation. Nasal mucosa are pink and moist without lesions or exudates. No septal dislocation .No obstruction to airflow.  Hyponasal speech  Oral exam: Dental hygiene is good; lips and gums are healthy appearing.There is no significant  oropharyngeal erythema or exudate noted.  Hoarse    Heart:  Normal rate and regular rhythm. S1 and S2 normal without gallop, murmur, click, rub or other extra sounds.   Lungs:Chest clear to auscultation; no wheezes, rhonchi,rales ,or rubs present.No increased work of breathing.    Extremities:  No cyanosis, edema, or clubbing  noted    Skin: Warm & dry            Assessment & Plan:   #1 URI, viral  Plan: See orders and recommendations

## 2011-10-30 NOTE — Patient Instructions (Signed)
Zicam Melts or Zinc lozenges ; vitamin C 2000 mg daily; & Echinacea for 4-7 days. Report fever, exudate("pus") or progressive pain. Plain Mucinex for thick secretions ;force NON dairy fluids . Use a Neti pot daily as needed for sinus congestion as discussed. Nasonex 1 spray in each nostril twice a day as needed. Use the "crossover" technique as discussed

## 2011-11-12 ENCOUNTER — Other Ambulatory Visit: Payer: Self-pay | Admitting: Internal Medicine

## 2011-11-12 DIAGNOSIS — T887XXA Unspecified adverse effect of drug or medicament, initial encounter: Secondary | ICD-10-CM

## 2011-11-12 DIAGNOSIS — E785 Hyperlipidemia, unspecified: Secondary | ICD-10-CM

## 2011-11-13 ENCOUNTER — Other Ambulatory Visit (INDEPENDENT_AMBULATORY_CARE_PROVIDER_SITE_OTHER): Payer: Medicare Other

## 2011-11-13 DIAGNOSIS — T887XXA Unspecified adverse effect of drug or medicament, initial encounter: Secondary | ICD-10-CM

## 2011-11-13 DIAGNOSIS — E785 Hyperlipidemia, unspecified: Secondary | ICD-10-CM

## 2011-11-13 LAB — LIPID PANEL
Cholesterol: 201 mg/dL — ABNORMAL HIGH (ref 0–200)
HDL: 49.6 mg/dL (ref >39.00)
Total CHOL/HDL Ratio: 4
Triglycerides: 94 mg/dL (ref 0.0–149.0)
VLDL: 18.8 mg/dL (ref 0.0–40.0)

## 2011-11-13 LAB — LDL CHOLESTEROL, DIRECT: Direct LDL: 129.6 mg/dL

## 2011-11-13 LAB — HEPATIC FUNCTION PANEL
Albumin: 3.8 g/dL (ref 3.5–5.2)
Total Bilirubin: 0.8 mg/dL (ref 0.3–1.2)

## 2011-11-13 LAB — CK: Total CK: 95 U/L (ref 7–232)

## 2011-11-13 NOTE — Progress Notes (Signed)
12  

## 2011-11-23 ENCOUNTER — Telehealth: Payer: Self-pay

## 2011-11-23 MED ORDER — PRAVASTATIN SODIUM 40 MG PO TABS: 40.0000 mg | ORAL_TABLET | Freq: Every day | ORAL | Status: DC

## 2011-11-23 NOTE — Telephone Encounter (Signed)
Message copied by Maurice Small on Fri Nov 23, 2011  5:06 PM ------      Message from: Pecola Lawless      Created: Tue Nov 20, 2011  5:54 PM       Please send a prescription for Pravastatin 40 mg; dispense 90, qhs

## 2011-11-23 NOTE — Telephone Encounter (Signed)
RX sent to local pharmacy on file

## 2011-11-24 ENCOUNTER — Emergency Department (HOSPITAL_COMMUNITY)
Admission: EM | Admit: 2011-11-24 | Discharge: 2011-11-24 | Disposition: A | Discharge status: Home / Self Care | Payer: Medicare Other | Attending: Emergency Medicine | Admitting: Emergency Medicine

## 2011-11-24 ENCOUNTER — Encounter (HOSPITAL_COMMUNITY): Payer: Self-pay | Admitting: *Deleted

## 2011-11-24 ENCOUNTER — Emergency Department (HOSPITAL_COMMUNITY): Payer: Medicare Other

## 2011-11-24 DIAGNOSIS — S8263XA Displaced fracture of lateral malleolus of unspecified fibula, initial encounter for closed fracture: Secondary | ICD-10-CM | POA: Insufficient documentation

## 2011-11-24 DIAGNOSIS — M7989 Other specified soft tissue disorders: Secondary | ICD-10-CM | POA: Insufficient documentation

## 2011-11-24 DIAGNOSIS — S8264XA Nondisplaced fracture of lateral malleolus of right fibula, initial encounter for closed fracture: Secondary | ICD-10-CM

## 2011-11-24 DIAGNOSIS — Y92410 Unspecified street and highway as the place of occurrence of the external cause: Secondary | ICD-10-CM | POA: Insufficient documentation
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
## 2011-11-25 NOTE — ED Provider Notes (Signed)
See prior note   Gearldene Fiorenza L Lyle Niblett, MD 11/25/11 1210 

## 2011-12-04 IMAGING — CR DG CHEST 2V
2 series · 2 of 2 positions shown · non-contrast
Comparison: Chest radiograph performed 07/16/2008

CLINICAL DATA: Right-sided chest pain.

CHEST - 2 VIEW

[w chest pa]
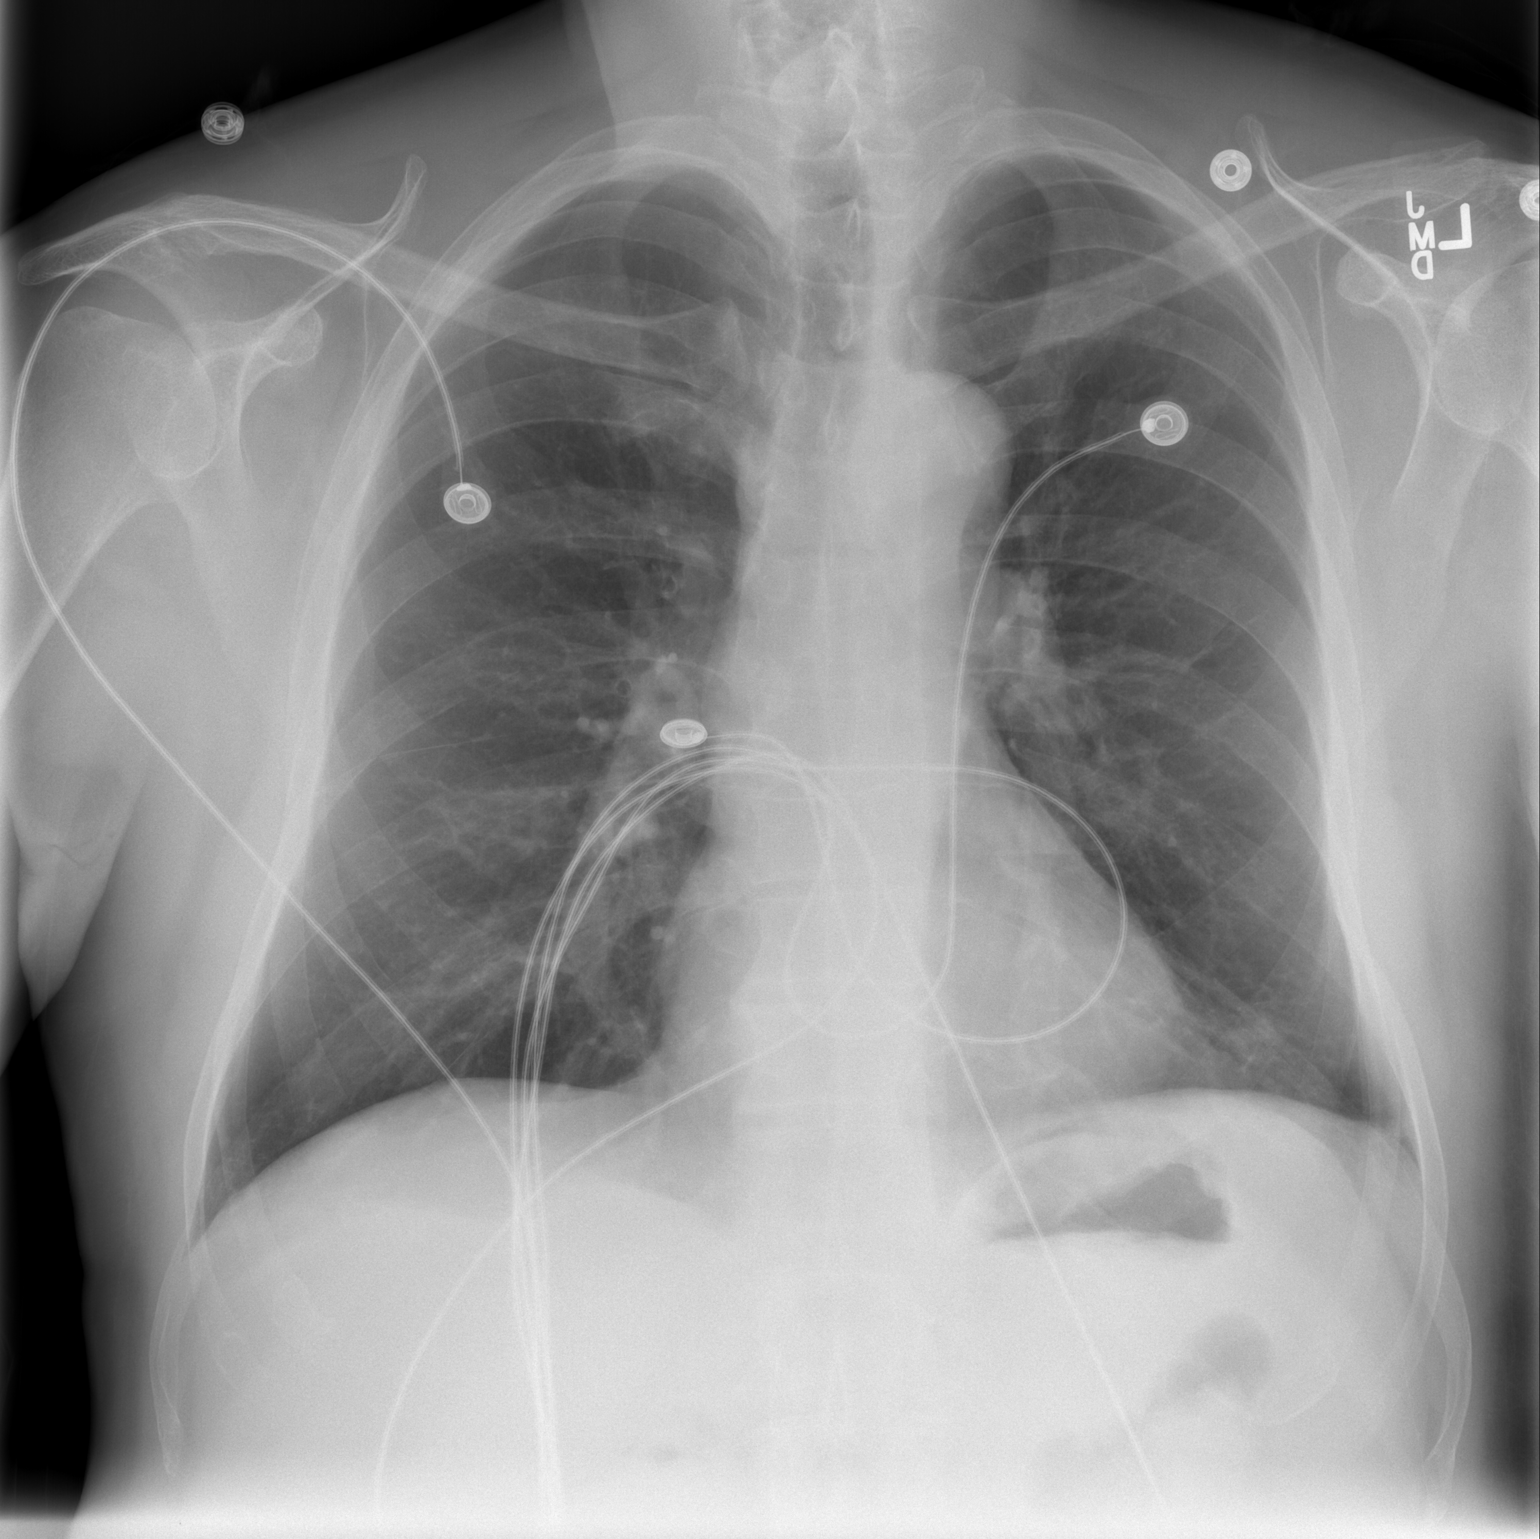

[w chest lat]
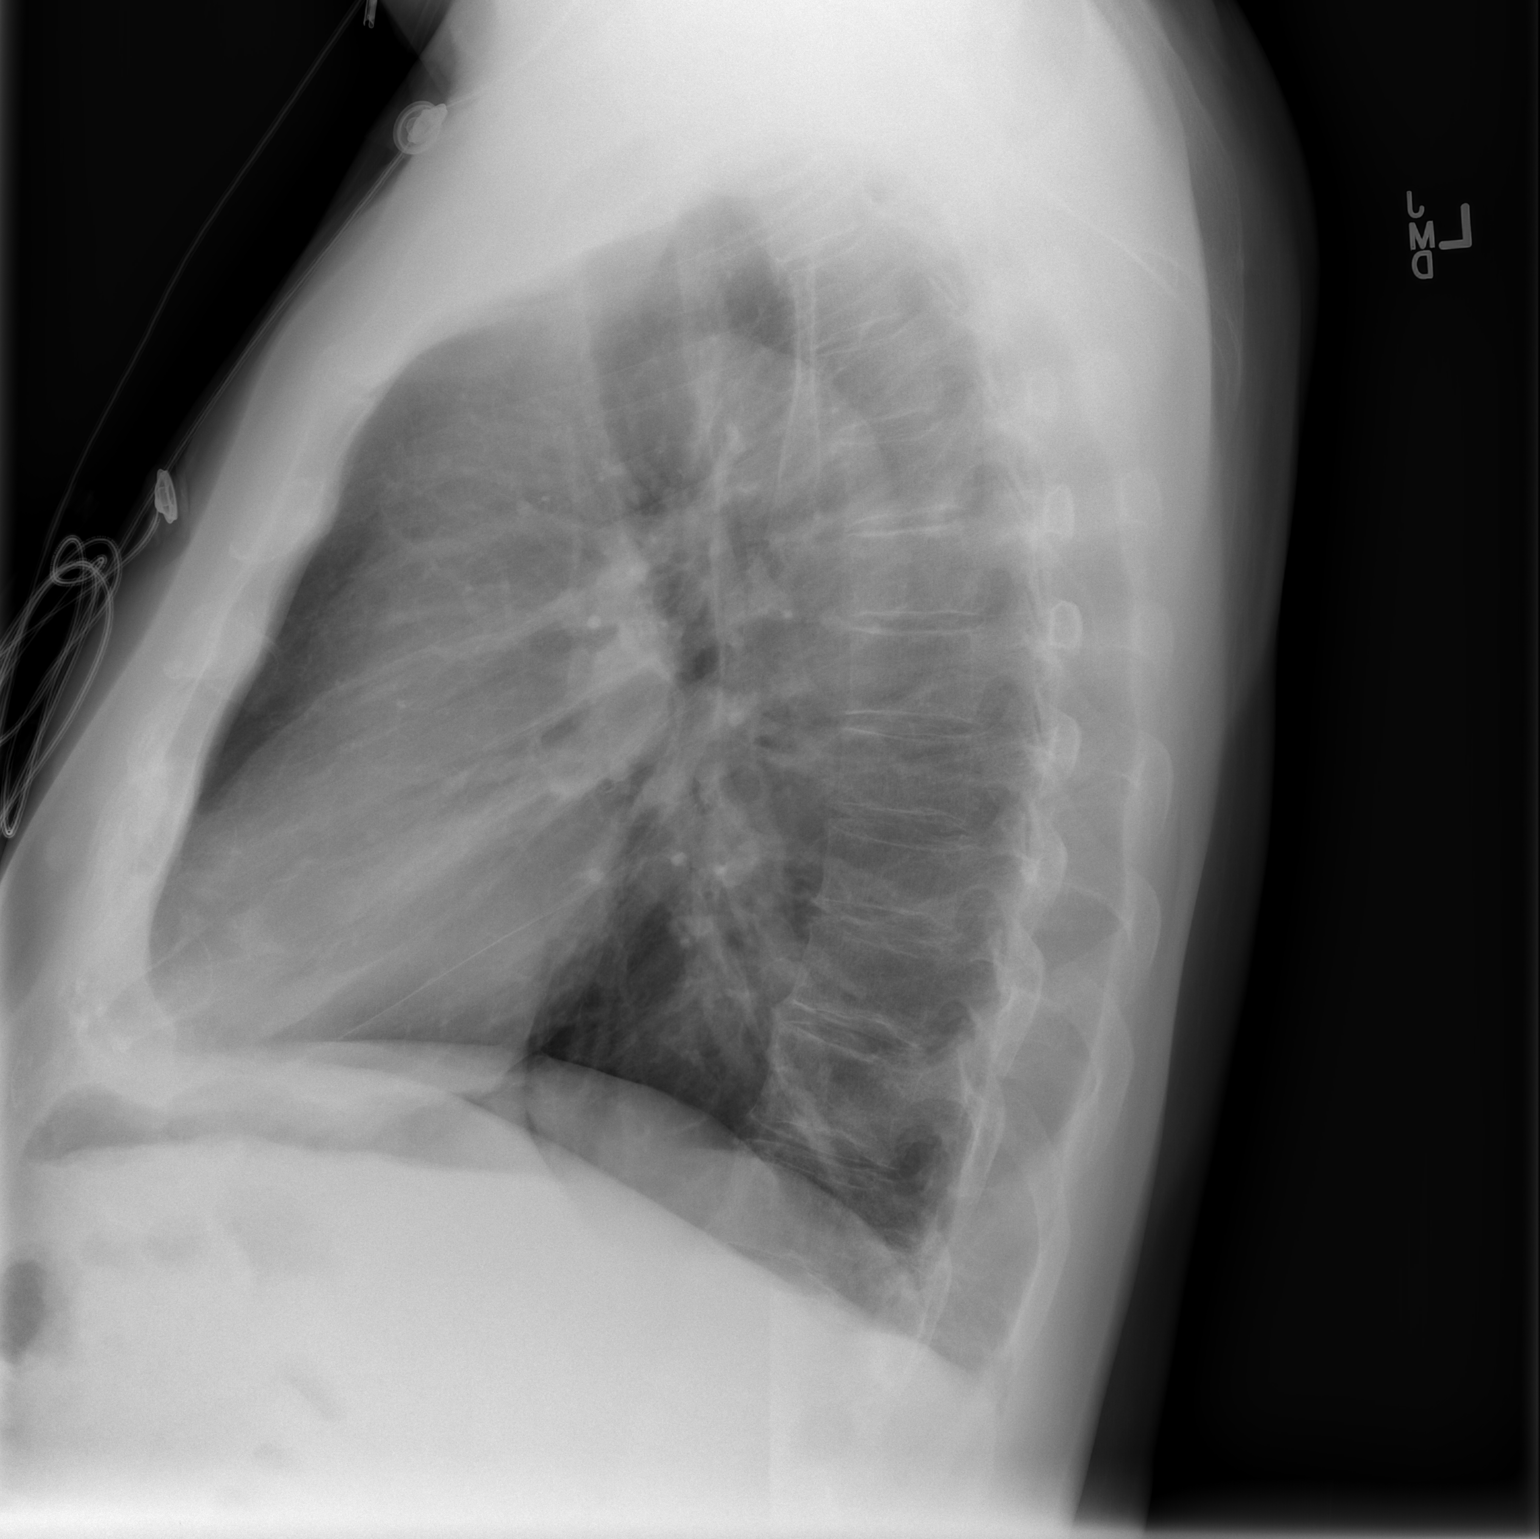

[2 of 2 positions shown; findings below may reference images not displayed]

FINDINGS: The lungs are well-aerated and clear.  There is no
evidence of focal opacification, pleural effusion or pneumothorax.
Minimal left basilar density likely reflects scarring.

The heart is normal in size; the mediastinal contour is within
normal limits.  No acute osseous abnormalities are seen.
IMPRESSION: No acute cardiopulmonary process seen.

## 2011-12-06 ENCOUNTER — Other Ambulatory Visit: Payer: Self-pay | Admitting: Internal Medicine

## 2011-12-06 ENCOUNTER — Telehealth: Payer: Self-pay | Admitting: Internal Medicine

## 2011-12-06 DIAGNOSIS — T887XXA Unspecified adverse effect of drug or medicament, initial encounter: Secondary | ICD-10-CM

## 2011-12-06 NOTE — Telephone Encounter (Signed)
LMOM for patient to call back w/more information regarding questions and/or concerns about medication. No answer/no ans machine at home phone number listed. Left msge on work/mobile number.

## 2011-12-06 NOTE — Telephone Encounter (Signed)
New cholesterol med was called in recently patient has a question about it

## 2011-12-06 NOTE — Telephone Encounter (Signed)
Patient states his pravastatin was to be doubled from 20 mg to 40 mg , patient realized that this medication causes him to have foot pain and joint pain. Patient stopped pravastatin 2-3 weeks ago(when he ran out of 20 mg) and since stopping med patient is with-out symptoms.   Per Dr.Hopper patient to have CK 995.20 (future order placed) and see him 3-5 days later  Scheduled appointment for CK to be drawn tomorrow, patient will schedule appointment for Dr.Hopper when he comes in for labs

## 2011-12-07 ENCOUNTER — Other Ambulatory Visit (INDEPENDENT_AMBULATORY_CARE_PROVIDER_SITE_OTHER): Payer: Medicare Other

## 2011-12-07 DIAGNOSIS — T887XXA Unspecified adverse effect of drug or medicament, initial encounter: Secondary | ICD-10-CM

## 2011-12-07 LAB — CK: Total CK: 94 U/L (ref 7–232)

## 2011-12-07 NOTE — Progress Notes (Signed)
LABS ONLY  

## 2011-12-14 ENCOUNTER — Ambulatory Visit: Payer: Medicare Other | Admitting: Internal Medicine

## 2011-12-18 ENCOUNTER — Encounter: Payer: Self-pay | Admitting: Internal Medicine

## 2011-12-18 ENCOUNTER — Ambulatory Visit (INDEPENDENT_AMBULATORY_CARE_PROVIDER_SITE_OTHER): Payer: Medicare Other | Admitting: Internal Medicine

## 2011-12-18 DIAGNOSIS — E785 Hyperlipidemia, unspecified: Secondary | ICD-10-CM

## 2011-12-18 NOTE — Assessment & Plan Note (Signed)
Present LDL 129.6 represents approximately a 30% increased risk as his LDL goal is less than 100. Options  discussed; these include rechecking the NMR lipoprofile after 4 months of dietary changes and exercise, versus low-dose pravastatin which is hydrophilic.

## 2011-12-18 NOTE — Patient Instructions (Signed)
Risk of premature heart attack or stroke increases as LDL or BAD cholesterol rises.Advanced cholesterol panels optimally determine risk based on particle composition ( NMR Lipoprofile ) or by assessing multiple other genetic risks([facility] Heart Panel or [facility] Lipid Panel). These are indicated when LDL is > 130, especially if there is family history of heart attack in males before 75 or women before 72. Based on your prior advanced testing, your LDL goal is < 100 , ideally < 70. Your present LDL increases long term heart attack or stroke risk approx 30 %.The best dietary  information on cholesterol is Dr Gildardo Griffes book Eat, Drink & Be Healthy.Exercise at least 30-45 minutes a day,  3-4 days a week.  Eat a low-fat diet with lots of fruits and vegetables, up to 7-9 servings per day.    Please  schedule fasting Labs : NMR Lipoprofile Lipid panel, CK after 4 months of lifestyle changes. PLEASE BRING THESE INSTRUCTIONS TO FOLLOW UP  LAB APPOINTMENT.This will guarantee correct labs are drawn, eliminating need for repeat blood sampling ( needle sticks ! ). Diagnoses /Codes: 272.4, 995.20
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
## 2012-04-16 ENCOUNTER — Other Ambulatory Visit: Payer: Medicare Other

## 2012-04-18 ENCOUNTER — Other Ambulatory Visit (INDEPENDENT_AMBULATORY_CARE_PROVIDER_SITE_OTHER): Payer: Medicare Other

## 2012-04-18 DIAGNOSIS — T887XXA Unspecified adverse effect of drug or medicament, initial encounter: Secondary | ICD-10-CM

## 2012-04-18 DIAGNOSIS — E785 Hyperlipidemia, unspecified: Secondary | ICD-10-CM

## 2012-04-18 LAB — CK: Total CK: 85 U/L (ref 7–232)

## 2012-04-18 LAB — LIPID PANEL: Total CHOL/HDL Ratio: 4

## 2012-04-18 NOTE — Progress Notes (Signed)
Labs only

## 2012-04-21 LAB — NMR LIPOPROFILE WITH LIPIDS
Cholesterol, Total: 182 mg/dL (ref <200)
HDL Particle Number: 27.2 umol/L — ABNORMAL LOW (ref >=30.5)
LDL Particle Number: 1497 nmol/L — ABNORMAL HIGH (ref <1000)
LDL Size: 21.1 nm (ref >20.5)
LP-IR Score: 16 (ref <=45)
Small LDL Particle Number: 362 nmol/L (ref <=527)
Triglycerides: 73 mg/dL (ref <150)

## 2012-06-09 ENCOUNTER — Encounter: Payer: Self-pay | Admitting: Internal Medicine

## 2012-07-23 ENCOUNTER — Encounter: Payer: Self-pay | Admitting: Pulmonary Disease

## 2012-07-23 ENCOUNTER — Ambulatory Visit (INDEPENDENT_AMBULATORY_CARE_PROVIDER_SITE_OTHER): Payer: Medicare Other | Admitting: Pulmonary Disease

## 2012-07-23 VITALS — BP 122/86 | HR 70 | Temp 98.0°F | Ht 72.0 in | Wt 200.0 lb

## 2012-07-23 DIAGNOSIS — G4733 Obstructive sleep apnea (adult) (pediatric): Secondary | ICD-10-CM

## 2012-07-23 NOTE — Patient Instructions (Addendum)
Continue on cpap, and keep up with supplies Work on weight loss followup with me in one year.  

## 2012-07-23 NOTE — Assessment & Plan Note (Signed)
The patient has a history of severe sleep apnea, but has done extremely well with CPAP.  He is having no issues with his mask fit or pressure, and feels that he sleeps well.  He is satisfied with his daytime alertness.  I have asked him to continue with CPAP, and to work on modest weight loss.

## 2012-07-23 NOTE — Progress Notes (Signed)
  Subjective:    Patient ID: [name], male    DOB: [birth year], 71 y.o.   MRN: [id number]  HPI The patient comes in today for followup of his known sleep apnea.  He is wearing CPAP compliant, and is having no issues with his mask fit or pressure.  He feels that he sleeps well with the device, and has no daytime alertness issues.  He has lost 3 pounds since last visit.   Review of Systems  Constitutional: Negative for fever and unexpected weight change.  HENT: Negative for ear pain, nosebleeds, congestion, sore throat, rhinorrhea, sneezing, trouble swallowing, dental problem, postnasal drip and sinus pressure.   Eyes: Negative for redness and itching.  Respiratory: Positive for shortness of breath. Negative for cough, chest tightness and wheezing.   Cardiovascular: Negative for palpitations and leg swelling.  Gastrointestinal: Negative for nausea and vomiting.  Genitourinary: Negative for dysuria.  Musculoskeletal: Negative for joint swelling.  Skin: Negative for rash.  Neurological: Negative for headaches.  Hematological: Does not bruise/bleed easily.  Psychiatric/Behavioral: Negative for dysphoric mood. The patient is not nervous/anxious.   All other systems reviewed and are negative.       Objective:   Physical Exam Well-developed male in no acute distress No skin breakdown or pressure necrosis from the CPAP mask Lower extremities without edema, no cyanosis Alert and oriented, does not appear to be sleepy, moves all 4 extremities.       Assessment & Plan:

## 2012-09-09 ENCOUNTER — Encounter: Payer: Self-pay | Admitting: Internal Medicine

## 2012-09-09 ENCOUNTER — Ambulatory Visit (INDEPENDENT_AMBULATORY_CARE_PROVIDER_SITE_OTHER): Payer: Medicare Other | Admitting: Internal Medicine

## 2012-09-09 VITALS — BP 122/74 | Temp 98.3°F | Resp 12 | Ht 72.0 in | Wt 196.0 lb

## 2012-09-09 DIAGNOSIS — E785 Hyperlipidemia, unspecified: Secondary | ICD-10-CM

## 2012-09-09 DIAGNOSIS — Z0000 Encounter for general adult medical examination without abnormal findings: Secondary | ICD-10-CM

## 2012-09-09 DIAGNOSIS — Z23 Encounter for immunization: Secondary | ICD-10-CM

## 2012-09-09 LAB — HEPATIC FUNCTION PANEL
AST: 20 U/L (ref 0–37)
Alkaline Phosphatase: 48 U/L (ref 39–117)
Bilirubin, Direct: 0.2 mg/dL (ref 0.0–0.3)
Total Bilirubin: 1.4 mg/dL — ABNORMAL HIGH (ref 0.3–1.2)

## 2012-09-09 LAB — BASIC METABOLIC PANEL
BUN: 16 mg/dL (ref 6–23)
CO2: 30 mEq/L (ref 19–32)
Chloride: 103 mEq/L (ref 96–112)
Creatinine, Ser: 1 mg/dL (ref 0.4–1.5)
Potassium: 3.8 mEq/L (ref 3.5–5.1)

## 2012-09-09 MED ORDER — PRAVASTATIN SODIUM 20 MG PO TABS: 20.0000 mg | ORAL_TABLET | Freq: Every day | ORAL | Status: DC

## 2012-09-09 NOTE — Progress Notes (Signed)
Subjective:    Patient ID: [name], male    DOB: 1941/10/13, 71 y.o.   MRN: [id number]  HPI Medicare Wellness Visit:  The following psychosocial & medical history were reviewed as required by Medicare.   Social history: caffeine: 6 cups / day of coffee , alcohol:  4 beers/ week,  tobacco use : snuff ( risk discussed)  & exercise : walking 1.5 mpd.   Home & personal  safety / fall risk: no issues activities of daily living: no limitations , seatbelt use : yes , and smoke alarm employment : yes .  Power of Attorney/Living Will status : in place  Vision ( as recorded per Nurse) & Hearing  evaluation :  Ophth exam in 2013.Hearing aids from [employer] :oriented X 3 , memory & recall :good, spelling  testing: good,and mood & affect : normal . Depression / anxiety: denied Travel history : 7/13 [place] , immunization status :Flu today , transfusion history:  no, and preventive health surveillance ( colonoscopies, BMD , etc as per protocol/ [facility]): colonoscopy pending, Dental care:  Every 6 mos . Chart reviewed &  Updated. Active issues reviewed & addressed.      Review of Systems He is not on a statin; he felt that this caused myalgias. Exercise as noted above; is not associated with chest pain, dyspnea, palpitations, or claudication. He is on no specific diet.  There is no family history of coronary disease; there is family history of cerebrovascular accident. Advanced cholesterol testing as documented in his LDL goal was less than 100. His last LDL was 125 in 5/13;nutritional interventions &  long term risk discussed. He is on prophylactic ASA.  He has been contacted about a followup colonoscopy. He denies unexplained weight loss, abdominal pain, melena, or rectal bleeding.     Objective:   Physical Exam Gen.:  well-nourished in appearance. Alert, appropriate and cooperative throughout exam. Head: Normocephalic without obvious abnormalities;  pattern alopecia  Eyes: No  corneal or conjunctival inflammation noted.  Extraocular motion intact. Vision grossly normal with lenses. Ears: External  ear exam reveals no significant lesions or deformities. Canals clear .TMs normal. Hearing is grossly decreased bilaterally. Nose: External nasal exam reveals no deformity or inflammation. Nasal mucosa are pink and moist. No lesions or exudates noted.  Mouth: Oral mucosa and oropharynx reveal no lesions or exudates. Teeth in good repair. Neck: No deformities, masses, or tenderness noted. Range of motion & Thyroid normal Lungs: Normal respiratory effort; chest expands symmetrically. Lungs are clear to auscultation without rales, wheezes, or increased work of breathing. Heart: Normal rate and rhythm. Normal S1 and S2. No gallop, click, or rub. S4 with slurring at  LSB  Abdomen: Bowel sounds normal; abdomen soft and nontender. No masses, organomegaly or hernias noted. Genitalia:Dr Darvin Neighbours                                                            Musculoskeletal/extremities: No deformity or scoliosis noted of  the thoracic or lumbar spine. No clubbing, cyanosis, edema, or deformity noted. Range of motion decreased L knee .Tone & strength  normal.Joints normal. Nail health  good. Vascular: Carotid, radial artery, dorsalis pedis and  posterior tibial pulses are full and equal. No bruits present. Neurologic: Alert and oriented x3.  Deep tendon reflex decreased @ L knee slightly         Skin: Intact without suspicious lesions or rashes. Lymph: No cervical, axillary lymphadenopathy present. Psych: Mood and affect are normal. Normally interactive                                                                                       Assessment & Plan:  #1 Medicare Wellness Exam; criteria met ; data entered #2 Problem List reviewed ; Assessment/ Recommendations made Plan: see Orders

## 2012-09-09 NOTE — Patient Instructions (Addendum)
Please review Dr Gildardo Griffes book Eat, Drink & Be Healthy for dietary cholesterol information. Please  schedule fasting Labs :  Lipid Panel,AST,ALT,CK after 10 weeks of medication & dietary changes to optimally address LDL risk. PLEASE BRING THESE INSTRUCTIONS TO FOLLOW UP  LAB APPOINTMENT.This will guarantee correct labs are drawn, eliminating need for repeat blood sampling ( needle sticks ! ). Diagnoses /Codes: 454.0,981.19

## 2012-09-10 ENCOUNTER — Encounter: Payer: Self-pay | Admitting: Internal Medicine

## 2012-09-24 ENCOUNTER — Ambulatory Visit (AMBULATORY_SURGERY_CENTER): Payer: Medicare Other | Admitting: *Deleted

## 2012-09-24 VITALS — Ht 72.0 in | Wt 196.4 lb

## 2012-09-24 DIAGNOSIS — Z1211 Encounter for screening for malignant neoplasm of colon: Secondary | ICD-10-CM

## 2012-09-24 MED ORDER — NA SULFATE-K SULFATE-MG SULF 17.5-3.13-1.6 GM/177ML PO SOLN: ORAL | Status: DC

## 2012-10-08 ENCOUNTER — Ambulatory Visit (AMBULATORY_SURGERY_CENTER): Payer: Medicare Other | Admitting: Internal Medicine

## 2012-10-08 ENCOUNTER — Encounter: Payer: Self-pay | Admitting: Internal Medicine

## 2012-10-08 VITALS — BP 117/71 | HR 58 | Temp 97.7°F | Resp 17 | Ht 72.0 in | Wt 196.0 lb

## 2012-10-08 DIAGNOSIS — D126 Benign neoplasm of colon, unspecified: Secondary | ICD-10-CM

## 2012-10-08 DIAGNOSIS — Z1211 Encounter for screening for malignant neoplasm of colon: Secondary | ICD-10-CM

## 2012-10-08 DIAGNOSIS — K648 Other hemorrhoids: Secondary | ICD-10-CM

## 2012-10-08 MED ORDER — SODIUM CHLORIDE 0.9 % IV SOLN: 500.0000 mL | INTRAVENOUS | Status: DC

## 2012-10-08 NOTE — Op Note (Signed)
[facility] [address] [place], [address]   COLONOSCOPY PROCEDURE REPORT  PATIENT: [name], [name]  MR#: [id number] BIRTHDATE: 04/06/1941 , 71  yrs. old GENDER: Male ENDOSCOPIST: Iva Boop, MD, [facility]  PROCEDURE DATE:  10/08/2012 PROCEDURE:   Colonoscopy with snare polypectomy ASA CLASS:   Class III INDICATIONS:average risk screening. MEDICATIONS: Propofol (Diprivan) 220 mg IV, MAC sedation, administered by CRNA, and These medications were titrated to patient response per physician's verbal order  DESCRIPTION OF PROCEDURE:   After the risks benefits and alternatives of the procedure were thoroughly explained, informed consent was obtained.  A digital rectal exam revealed no abnormalities of the rectum and A digital rectal exam revealed the prostate was not enlarged.   The LB CF-[id number]  endoscope was introduced through the anus and advanced to the cecum, which was identified by both the appendix and ileocecal valve. No adverse events experienced.   The quality of the prep was Suprep excellent The instrument was then slowly withdrawn as the colon was fully examined.      COLON FINDINGS: Two diminutive polypoid shaped sessile polyps were found in the ascending colon and transverse colon.  A polypectomy was performed with a cold snare.  The resection was complete and the polyp tissue was completely retrieved.   Small internal hemorrhoids were found.   The colon mucosa was otherwise normal. Retroflexed views revealed internal hemorrhoids. The time to cecum=2 minutes 13 seconds.  Withdrawal time=11 minutes 34 seconds. The scope was withdrawn and the procedure completed. COMPLICATIONS: There were no complications.  ENDOSCOPIC IMPRESSION: 1.   Two diminutive sessile polyps were found in the ascending colon and transverse colon; polypectomy was performed with a cold snare 2.   Small internal hemorrhoids 3.   The colon mucosa was otherwise  normal, excellent prep  RECOMMENDATIONS: Timing of repeat colonoscopy will be determined by pathology findings.   eSigned:  Iva Boop, MD, [facility] 10/08/2012 11:56 AM cc: Pecola Lawless, MD and The Patient

## 2012-10-08 NOTE — Patient Instructions (Addendum)
Two tiny polyps were removed, I believe they are benign and will let you know.  You also have diverticulosis.  Thank you for choosing me and [facility].  Iva Boop, MD, FACG   YOU HAD AN ENDOSCOPIC PROCEDURE TODAY AT THE [facility]: Refer to the procedure report that was given to you for any specific questions about what was found during the examination.  If the procedure report does not answer your questions, please call your gastroenterologist to clarify.  If you requested that your care partner not be given the details of your procedure findings, then the procedure report has been included in a sealed envelope for you to review at your convenience later.  YOU SHOULD EXPECT: Some feelings of bloating in the abdomen. Passage of more gas than usual.  Walking can help get rid of the air that was put into your GI tract during the procedure and reduce the bloating. If you had a lower endoscopy (such as a colonoscopy or flexible sigmoidoscopy) you may notice spotting of blood in your stool or on the toilet paper. If you underwent a bowel prep for your procedure, then you may not have a normal bowel movement for a few days.  DIET: Your first meal following the procedure should be a light meal and then it is ok to progress to your normal diet.  A half-sandwich or bowl of soup is an example of a good first meal.  Heavy or fried foods are harder to digest and may make you feel nauseous or bloated.  Likewise meals heavy in dairy and vegetables can cause extra gas to form and this can also increase the bloating.  Drink plenty of fluids but you should avoid alcoholic beverages for 24 hours.  ACTIVITY: Your care partner should take you home directly after the procedure.  You should plan to take it easy, moving slowly for the rest of the day.  You can resume normal activity the day after the procedure however you should NOT DRIVE or use heavy machinery for 24 hours (because of the  sedation medicines used during the test).    SYMPTOMS TO REPORT IMMEDIATELY: A gastroenterologist can be reached at any hour.  During normal business hours, 8:30 AM to 5:00 PM Monday through Friday, call [phone].  After hours and on weekends, please call the GI answering service at [phone] who will take a message and have the physician on call contact you.   Following lower endoscopy (colonoscopy or flexible sigmoidoscopy):  Excessive amounts of blood in the stool  Significant tenderness or worsening of abdominal pains  Swelling of the abdomen that is new, acute  Fever of 100F or higher   FOLLOW UP: If any biopsies were taken you will be contacted by phone or by letter within the next 1-3 weeks.  Call your gastroenterologist if you have not heard about the biopsies in 3 weeks.  Our staff will call the home number listed on your records the next business day following your procedure to check on you and address any questions or concerns that you may have at that time regarding the information given to you following your procedure. This is a courtesy call and so if there is no answer at the home number and we have not heard from you through the emergency physician on call, we will assume that you have returned to your regular daily activities without incident.  SIGNATURES/CONFIDENTIALITY: You and/or your care partner have signed paperwork which will  be entered into your electronic medical record.  These signatures attest to the fact that that the information above on your After Visit Summary has been reviewed and is understood.  Full responsibility of the confidentiality of this discharge information lies with you and/or your care-partner.   Polyp and hemorrhoid information given.

## 2012-10-08 NOTE — Progress Notes (Signed)
Patient did not experience any of the following events: a burn prior to discharge; a fall within the facility; wrong site/side/patient/procedure/implant event; or a hospital transfer or hospital admission upon discharge from the facility. (G8907) Patient did not have preoperative order for IV antibiotic SSI prophylaxis. (G8918)  

## 2012-10-09 ENCOUNTER — Telehealth: Payer: Self-pay | Admitting: *Deleted

## 2012-10-09 NOTE — Telephone Encounter (Signed)
  Follow up Call-  Call back number 10/08/2012  Post procedure Call Back phone  # [phone] home          [phone] cell  Permission to leave phone message Yes     Patient questions:  Do you have a fever, pain , or abdominal swelling? no Pain Score  0 *  Have you tolerated food without any problems? yes  Have you been able to return to your normal activities? yes  Do you have any questions about your discharge instructions: Diet   no Medications  no Follow up visit  no  Do you have questions or concerns about your Care? no  Actions: * If pain score is 4 or above: No action needed, pain <4.

## 2012-10-14 ENCOUNTER — Encounter: Payer: Self-pay | Admitting: Internal Medicine

## 2012-10-14 DIAGNOSIS — Z860100 Personal history of colon polyps, unspecified: Secondary | ICD-10-CM | POA: Insufficient documentation

## 2012-10-14 NOTE — Progress Notes (Signed)
Quick Note:  Two diminutive adenomas Repeat colon about 10/2017 ______

## 2012-11-17 ENCOUNTER — Other Ambulatory Visit (INDEPENDENT_AMBULATORY_CARE_PROVIDER_SITE_OTHER): Payer: Medicare Other

## 2012-11-17 DIAGNOSIS — T887XXA Unspecified adverse effect of drug or medicament, initial encounter: Secondary | ICD-10-CM

## 2012-11-17 DIAGNOSIS — E785 Hyperlipidemia, unspecified: Secondary | ICD-10-CM

## 2012-11-17 LAB — LIPID PANEL
HDL: 54.9 mg/dL (ref >39.00)
LDL Cholesterol: 91 mg/dL (ref 0–99)
Total CHOL/HDL Ratio: 3
Triglycerides: 80 mg/dL (ref 0.0–149.0)

## 2012-11-18 ENCOUNTER — Other Ambulatory Visit: Payer: Medicare Other

## 2012-12-01 IMAGING — CR DG ANKLE COMPLETE 3+V*R*
3 series · 3 of 3 positions shown · non-contrast
Comparison: None.

CLINICAL DATA: Trauma.  Fall

RIGHT ANKLE - COMPLETE 3+ VIEW

[x ankle ap right]
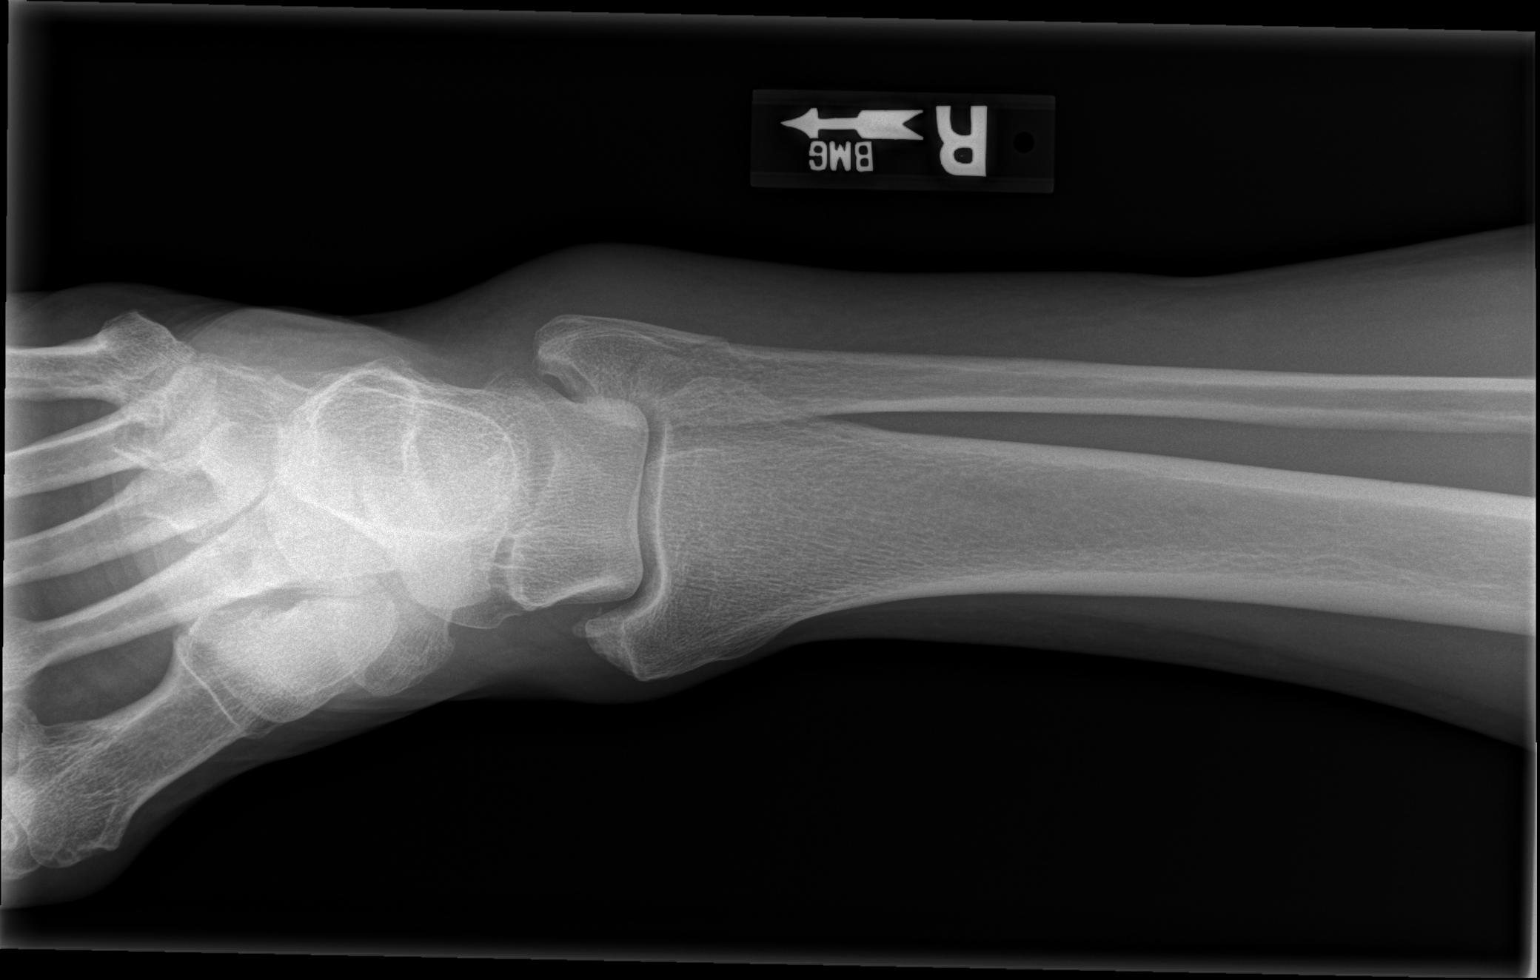

[x ankle obl right]
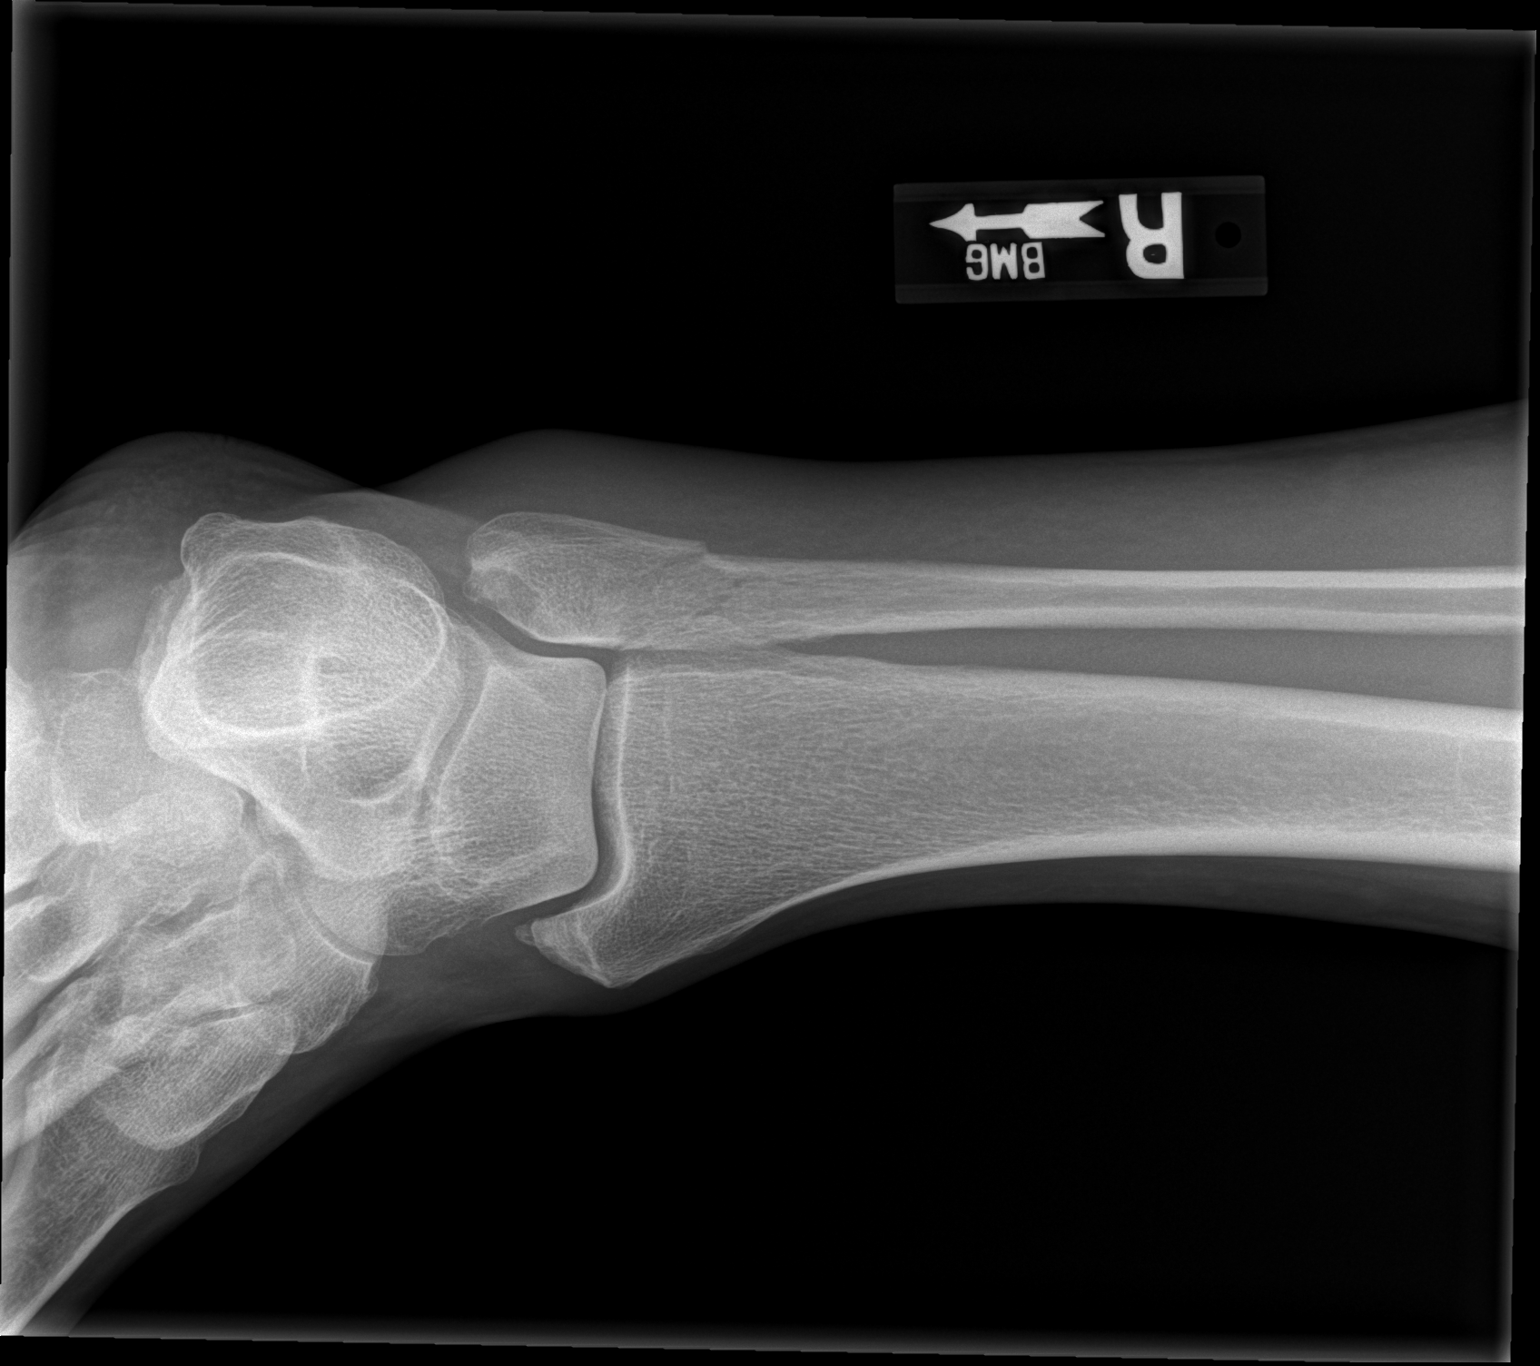

[x ankle lat right]
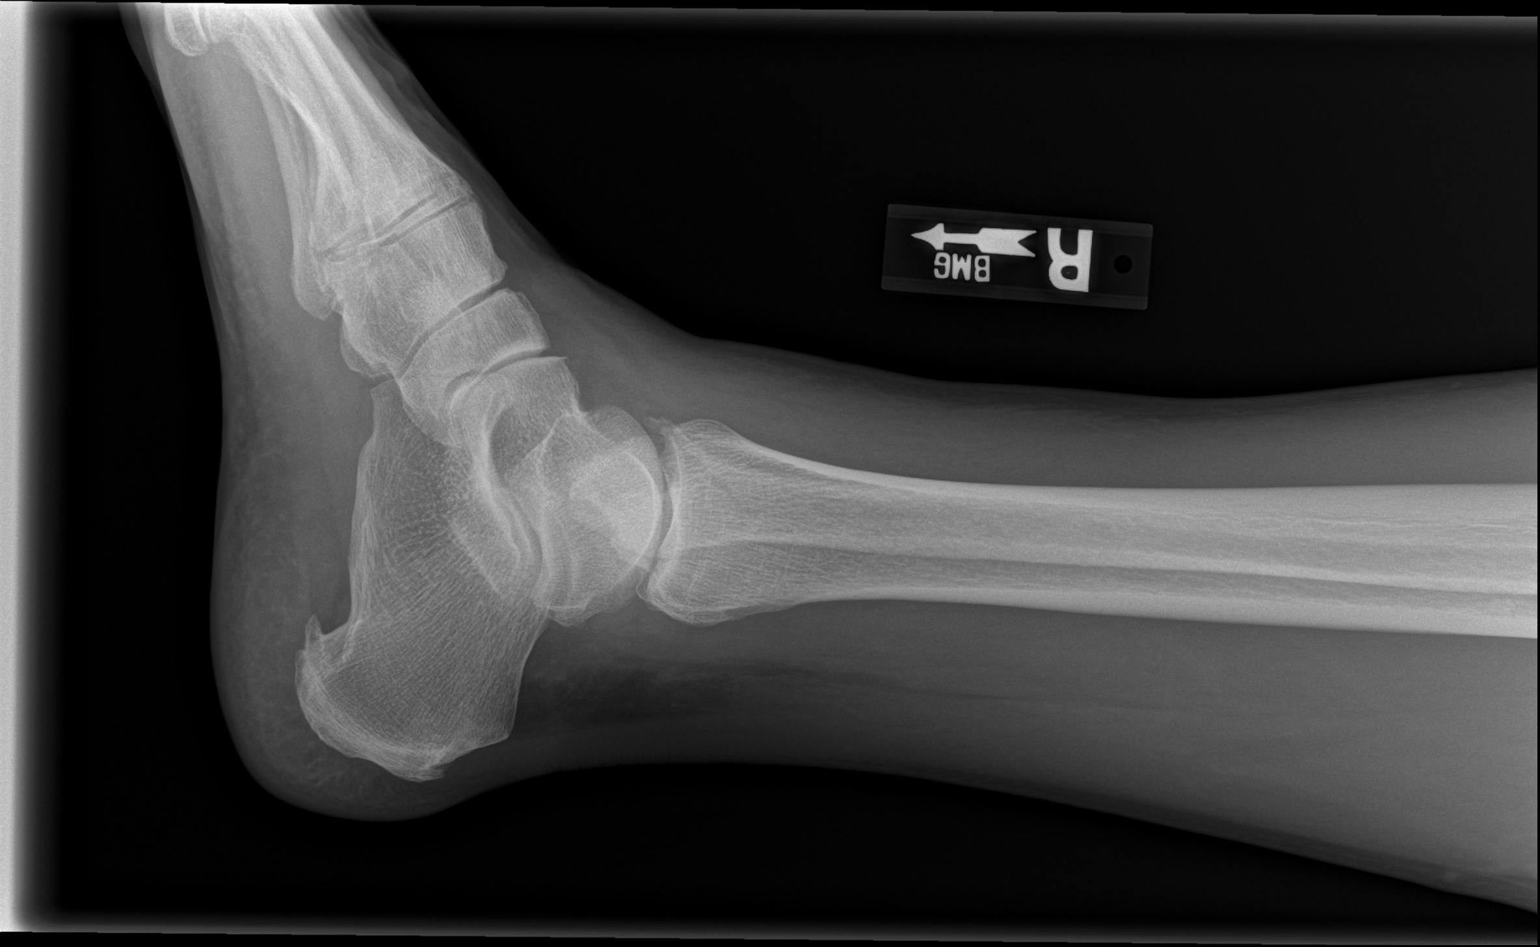

[3 of 3 positions shown; findings below may reference images not displayed]

FINDINGS: There is a moderate to marked lateral soft tissue swelling.

There is an intra-articular lateral malleolar fracture identified.

There is mild lateral displacement of the distal fracture
fragments.

No additional fractures or dislocations identified.

Posterior and plantar heel spurs are identified.
IMPRESSION: 1.  Lateral malleolar fracture.

## 2013-06-01 ENCOUNTER — Ambulatory Visit (INDEPENDENT_AMBULATORY_CARE_PROVIDER_SITE_OTHER)
Admission: RE | Admit: 2013-06-01 | Discharge: 2013-06-01 | Disposition: A | Discharge status: Home / Self Care | Payer: Medicare Other | Source: Ambulatory Visit | Attending: Internal Medicine | Admitting: Internal Medicine

## 2013-06-01 ENCOUNTER — Encounter: Payer: Self-pay | Admitting: Internal Medicine

## 2013-06-01 ENCOUNTER — Ambulatory Visit (INDEPENDENT_AMBULATORY_CARE_PROVIDER_SITE_OTHER): Payer: Medicare Other | Admitting: Internal Medicine

## 2013-06-01 VITALS — BP 138/82 | HR 68 | Temp 97.8°F | Wt 197.0 lb

## 2013-06-01 DIAGNOSIS — R0789 Other chest pain: Secondary | ICD-10-CM

## 2013-06-01 DIAGNOSIS — M79672 Pain in left foot: Secondary | ICD-10-CM

## 2013-06-01 DIAGNOSIS — R5381 Other malaise: Secondary | ICD-10-CM

## 2013-06-01 DIAGNOSIS — R1032 Left lower quadrant pain: Secondary | ICD-10-CM

## 2013-06-01 DIAGNOSIS — R5383 Other fatigue: Secondary | ICD-10-CM

## 2013-06-01 DIAGNOSIS — M79609 Pain in unspecified limb: Secondary | ICD-10-CM

## 2013-06-01 LAB — CBC WITH DIFFERENTIAL/PLATELET
Basophils Absolute: 0 10*3/uL (ref 0.0–0.1)
Eosinophils Relative: 3.2 % (ref 0.0–5.0)
HCT: 42.5 % (ref 39.0–52.0)
Hemoglobin: 13.9 g/dL (ref 13.0–17.0)
Lymphs Abs: 1.5 10*3/uL (ref 0.7–4.0)
MCV: 92 fl (ref 78.0–100.0)
Monocytes Absolute: 0.5 10*3/uL (ref 0.1–1.0)
Neutro Abs: 3.1 10*3/uL (ref 1.4–7.7)
Platelets: 158 10*3/uL (ref 150.0–400.0)
RDW: 14.1 % (ref 11.5–14.6)

## 2013-06-01 LAB — TSH: TSH: 1.97 u[IU]/mL (ref 0.35–5.50)

## 2013-06-01 LAB — BASIC METABOLIC PANEL
BUN: 19 mg/dL (ref 6–23)
Calcium: 9.3 mg/dL (ref 8.4–10.5)
Creatinine, Ser: 0.9 mg/dL (ref 0.4–1.5)

## 2013-06-01 NOTE — Patient Instructions (Addendum)
Order for x-rays entered into  the computer; these will be performed at [address] across from [facility] No appointment is necessary. Minimal Blood Pressure Goal= AVERAGE < 140/90;  Ideal is an AVERAGE < 135/85. This AVERAGE should be calculated from @ least 5-7 BP readings taken @ different times of day on different days of week. You should not respond to isolated BP readings , but rather the AVERAGE for that week .Please bring your  blood pressure cuff to office visits to verify that it is reliable.It  can also be checked against the blood pressure device at the pharmacy. Finger or wrist cuffs are not dependable; an arm cuff is.

## 2013-06-01 NOTE — Progress Notes (Signed)
Subjective:    Patient ID: [name], male    DOB: 1941/03/14, 72 y.o.   MRN: [id number]  HPI  Over the last 2-3 weeks he has felt "washed out". Intermittently he has also noted pain in the dorsum of the left foot and pain in the left inguinal area with ambulation.  He also had a sharp right sided chest pain which radiated obliquely and laterally to the mid axillary line. This occurred on 3 occasions during one week. It was reminiscent of his gallbladder pain prior to the surgery the pain would last up to 2 minutes and occurred at rest  These symptoms have essentially resolved.  He does state that he is under stress packing home as he'll be moving to [place] in the near future.     Review of Systems His blood pressure today is much higher than usual; at the urologist it was 117  last week. Patient reports no  vision/ hearing changes,anorexia, weight change, fever ,adenopathy, persistant hoarseness, swallowing issues, palpitations, edema,persistant / recurrent cough, hemoptysis, dyspnea(rest, exertional, paroxysmal nocturnal), gastrointestinal  bleeding (melena, rectal bleeding), abdominal pain, excessive heart burn, GU symptoms( dysuria, hematuria, pyuria, voiding/incontinence  issues) syncope, focal weakness, numbness & tingling, skin/hair/nail changes,depression, anxiety,or abnormal bruising/bleeding.     Objective:   Physical Exam Gen.:  well-nourished in appearance. Alert, appropriate and cooperative throughout exam.Appears younger than stated age  Head: Normocephalic without obvious abnormalities; pattern alopecia  Eyes: No corneal or conjunctival inflammation noted. Slight lid lag. Extraocular motion intact. No jaundice. Nose: External nasal exam reveals no deformity or inflammation. Nasal mucosa are pink and moist. No lesions or exudates noted.   Mouth: Oral mucosa and oropharynx reveal no lesions or exudates. Teeth in good repair. Neck: No deformities, masses, or tenderness  noted. Range of motion decreased. Thyroid normal. Lungs: Normal respiratory effort; chest expands symmetrically. Lungs are clear to auscultation without rales, wheezes, or increased work of breathing. Chest: No discomfort to compression Heart: Normal rate and rhythm. Normal S1 and S2. No gallop, click, or rub. No murmur. Abdomen: Bowel sounds normal; abdomen soft and nontender. No masses, organomegaly or hernias noted. Genitalia:As per Dr Earlene Plater                                 Musculoskeletal/extremities: No deformity or scoliosis noted of  the thoracic or lumbar spine.  No clubbing, cyanosis, edema, or significant extremity  deformity noted. Range of motion normal ; no pain L inguinal area with ROM.Tone & strength  Normal. No pain with compression L foot Joints normal . Nail health good. Able to lie down & sit up w/o help. Negative SLR bilaterally Vascular: Carotid, radial artery, dorsalis pedis and  posterior tibial pulses are full and equal. No bruits present. Neurologic: Alert and oriented x3. Deep tendon reflexes symmetrical but 0-1/2+ @ knees.  Gait normal  including heel & toe walking .        Skin: Intact without suspicious lesions or rashes. Lymph: No cervical, axillary lymphadenopathy present. Psych: Mood and affect are normal. Normally interactive  Assessment & Plan:  #1 left foot pain, resolved. This may have been a plantar fasciitis variant.  #2 left inguinal pain; probable ligamentous strain; resolved  #3 atypical right chest pain x3  #4 malaise; resolved  Plan see orders and recommendations

## 2013-06-02 ENCOUNTER — Encounter: Payer: Self-pay | Admitting: *Deleted

## 2013-07-23 ENCOUNTER — Ambulatory Visit: Payer: Medicare Other | Admitting: Pulmonary Disease

## 2013-08-17 ENCOUNTER — Encounter: Payer: Self-pay | Admitting: Pulmonary Disease

## 2013-08-17 ENCOUNTER — Ambulatory Visit (INDEPENDENT_AMBULATORY_CARE_PROVIDER_SITE_OTHER): Payer: Medicare Other | Admitting: Pulmonary Disease

## 2013-08-17 VITALS — BP 130/82 | HR 64 | Temp 97.1°F | Wt 202.2 lb

## 2013-08-17 DIAGNOSIS — G4733 Obstructive sleep apnea (adult) (pediatric): Secondary | ICD-10-CM

## 2013-08-17 NOTE — Progress Notes (Signed)
  Subjective:    Patient ID: [name], male    DOB: Oct 12, 1941, 72 y.o.   MRN: [id number]  HPI Patient comes in today for followup of his obstructive sleep apnea.  He is wearing CPAP compliantly, and is having no issues with his mask fit or pressure.  He wears compliantly, and feels that his sleep and daytime alertness are doing well.  His weight is up 2 pounds from the last visit.   Review of Systems  Constitutional: Negative for fever and unexpected weight change.  HENT: Negative for ear pain, nosebleeds, congestion, sore throat, rhinorrhea, sneezing, trouble swallowing, dental problem, postnasal drip and sinus pressure.   Eyes: Negative for redness and itching.  Respiratory: Positive for cough and wheezing. Negative for chest tightness and shortness of breath.   Cardiovascular: Negative for palpitations and leg swelling.  Gastrointestinal: Negative for nausea and vomiting.  Genitourinary: Negative for dysuria.  Musculoskeletal: Negative for joint swelling.  Skin: Negative for rash.  Neurological: Negative for headaches.  Hematological: Does not bruise/bleed easily.  Psychiatric/Behavioral: Negative for dysphoric mood. The patient is not nervous/anxious.        Objective:   Physical Exam Overweight male in no acute distress Nose without purulence or discharge noted No skin breakdown or pressure necrosis from the CPAP mask Neck without lymphadenopathy or thyromegaly Lower extremities without edema, no cyanosis Alert and oriented, moves all 4 extremities.  Does not appear to be sleepy.        Assessment & Plan:

## 2013-08-17 NOTE — Patient Instructions (Addendum)
Continue on cpap, and keep up with mask changes and supplies. followup with me in one year if doing well.

## 2013-08-17 NOTE — Assessment & Plan Note (Signed)
The patient is doing well on his current CPAP setup, and feels that his sleep and daytime alertness are adequate.  He has been keeping up with his supplies and mask changes, and I have also encouraged him to work on weight loss.  He will followup with me in one year if doing well.

## 2013-10-15 ENCOUNTER — Other Ambulatory Visit: Payer: Self-pay

## 2014-01-04 ENCOUNTER — Other Ambulatory Visit: Payer: Self-pay | Admitting: Internal Medicine

## 2014-01-04 NOTE — Telephone Encounter (Signed)
Pravastatin refilled per protocol. JG//CMA 

## 2014-05-20 ENCOUNTER — Ambulatory Visit (INDEPENDENT_AMBULATORY_CARE_PROVIDER_SITE_OTHER): Payer: Medicare Other | Admitting: Internal Medicine

## 2014-05-20 ENCOUNTER — Encounter: Payer: Self-pay | Admitting: Internal Medicine

## 2014-05-20 ENCOUNTER — Other Ambulatory Visit (INDEPENDENT_AMBULATORY_CARE_PROVIDER_SITE_OTHER): Payer: Medicare Other

## 2014-05-20 VITALS — BP 120/80 | HR 65 | Temp 97.8°F | Wt 194.8 lb

## 2014-05-20 DIAGNOSIS — R0789 Other chest pain: Secondary | ICD-10-CM

## 2014-05-20 DIAGNOSIS — I10 Essential (primary) hypertension: Secondary | ICD-10-CM

## 2014-05-20 DIAGNOSIS — R5381 Other malaise: Secondary | ICD-10-CM

## 2014-05-20 DIAGNOSIS — R5383 Other fatigue: Secondary | ICD-10-CM

## 2014-05-20 DIAGNOSIS — IMO0001 Reserved for inherently not codable concepts without codable children: Secondary | ICD-10-CM

## 2014-05-20 DIAGNOSIS — E785 Hyperlipidemia, unspecified: Secondary | ICD-10-CM

## 2014-05-20 LAB — BASIC METABOLIC PANEL
BUN: 15 mg/dL (ref 6–23)
CHLORIDE: 105 meq/L (ref 96–112)
CO2: 33 mEq/L — ABNORMAL HIGH (ref 19–32)
CREATININE: 0.9 mg/dL (ref 0.4–1.5)
Calcium: 9.6 mg/dL (ref 8.4–10.5)
GFR: 91.91 mL/min (ref >60.00)
GLUCOSE: 89 mg/dL (ref 70–99)
Potassium: 4.2 mEq/L (ref 3.5–5.1)
Sodium: 141 mEq/L (ref 135–145)

## 2014-05-20 LAB — HEPATIC FUNCTION PANEL
ALT: 17 U/L (ref 0–53)
AST: 19 U/L (ref 0–37)
Albumin: 3.7 g/dL (ref 3.5–5.2)
Alkaline Phosphatase: 51 U/L (ref 39–117)
BILIRUBIN DIRECT: 0.1 mg/dL (ref 0.0–0.3)
BILIRUBIN TOTAL: 0.3 mg/dL (ref 0.2–1.2)
Total Protein: 6.3 g/dL (ref 6.0–8.3)

## 2014-05-20 LAB — CBC WITH DIFFERENTIAL/PLATELET
Basophils Absolute: 0 10*3/uL (ref 0.0–0.1)
Basophils Relative: 0.5 % (ref 0.0–3.0)
EOS ABS: 0.2 10*3/uL (ref 0.0–0.7)
Eosinophils Relative: 2.7 % (ref 0.0–5.0)
HEMATOCRIT: 42 % (ref 39.0–52.0)
Hemoglobin: 14 g/dL (ref 13.0–17.0)
LYMPHS ABS: 1.7 10*3/uL (ref 0.7–4.0)
Lymphocytes Relative: 27.6 % (ref 12.0–46.0)
MCHC: 33.4 g/dL (ref 30.0–36.0)
MCV: 89.3 fl (ref 78.0–100.0)
MONO ABS: 0.5 10*3/uL (ref 0.1–1.0)
Monocytes Relative: 8.4 % (ref 3.0–12.0)
NEUTROS PCT: 60.8 % (ref 43.0–77.0)
Neutro Abs: 3.7 10*3/uL (ref 1.4–7.7)
Platelets: 175 10*3/uL (ref 150.0–400.0)
RBC: 4.7 Mil/uL (ref 4.22–5.81)
RDW: 14.8 % (ref 11.5–15.5)
WBC: 6.1 10*3/uL (ref 4.0–10.5)

## 2014-05-20 LAB — LIPID PANEL
CHOL/HDL RATIO: 4
CHOLESTEROL: 181 mg/dL (ref 0–200)
HDL: 43.8 mg/dL (ref >39.00)
LDL CALC: 114 mg/dL — AB (ref 0–99)
NonHDL: 137.2
TRIGLYCERIDES: 115 mg/dL (ref 0.0–149.0)
VLDL: 23 mg/dL (ref 0.0–40.0)

## 2014-05-20 LAB — CK: Total CK: 69 U/L (ref 7–232)

## 2014-05-20 LAB — TSH: TSH: 1.78 u[IU]/mL (ref 0.35–4.50)

## 2014-05-20 LAB — T4, FREE: Free T4: 0.94 ng/dL (ref 0.60–1.60)

## 2014-05-20 MED ORDER — FLUOXETINE HCL 10 MG PO CAPS: 10.0000 mg | ORAL_CAPSULE | Freq: Every day | ORAL | Status: AC

## 2014-05-20 NOTE — Progress Notes (Signed)
Subjective:    Patient ID: [name], male    DOB: 1941/12/05, 73 y.o.   MRN: [id number]  HPI Pt presents today for a 6 month history of feeling "washed out" and "tired." The pt has felt lethargic, had occasional headaches and occasional chest tightness over the past 6 months. When the pt has the chest tightness it is unrelated to activity and he states he feels like he might "miss a beat" when this happens. He has not had any lightheadedness or dizziness.   His wife has a BP cuff at home and they have checked his pressure a couple of times over the past few months and are worried it may be elevated though he does not have a history of HTN. They have not gotten any readings above 140s/90s. He specifically denies changes in vision and recently had an eye exam at which point he was told his vision was "great for his age" and there were no issues.   The pt also complains of some pain on the tops of his feet that occurs when he has been sitting for an hour or two. There is no tingling or numbness. Pt had been switched from crestor to pravastatin. The pt does report he quit taking pravastatin 3 or 4 months ago due to arthralgias and since then has had no muscle pain. Currently the pt is only taking ASA.   Pt has no personal history nor family history of thyroid disease.  Review of Systems  Constitutional: Positive for fatigue. Negative for fever and chills.  HENT: Negative for congestion, sinus pressure and sneezing.   Eyes: Negative for visual disturbance.  Respiratory: Positive for chest tightness. Negative for shortness of breath.   Cardiovascular: Negative for chest pain and leg swelling.       No claudication  Gastrointestinal: Negative for diarrhea and constipation.  Musculoskeletal: Negative for arthralgias and joint swelling.  Neurological: Negative for dizziness, syncope, weakness and numbness.  Psychiatric/Behavioral: Negative for confusion.      Objective:   Physical  Exam Gen.: Healthy and well-nourished in appearance. Alert, appropriate and cooperative throughout exam. Appears younger than stated age  Eyes: No corneal or conjunctival inflammation noted. Pupils equal round reactive to light and accommodation. Extraocular motion intact.  Ears: External  ear exam reveals no significant lesions or deformities. Canals clear .TMs normal. Hearing is grossly normal bilaterally. Nose: External nasal exam reveals no deformity or inflammation. Nasal mucosa are pink and moist. No lesions or exudates noted.   Mouth: Oral mucosa and oropharynx reveal no lesions or exudates. Teeth in good repair. Neck: No thyromegaly Lungs: Normal respiratory effort; chest expands symmetrically. Lungs are clear to auscultation without rales, wheezes, or increased work of breathing. Heart: Normal rate and rhythm. Normal S1 and S2. No gallop, click, or rub.No murmur. Abdomen: Bowel sounds normal; abdomen soft and nontender. No masses, organomegaly or hernia   Musculoskeletal/extremities: Range of motion normal. Tone & strength normal. Able to lie down & sit up w/o help.  Vascular: Carotid, radial artery, dorsalis pedis pulses are full and equal.  Skin: Intact without suspicious lesions or rashes. Lymph: No cervical, axillary lymphadenopathy present.  Assessment & Plan:  #1 fatigue; unspecified. Pt has not had labs in 1 year. Will recheck TSH, CBC, BMP. Pt has not had Vit D, will check today for Vit D deficiency.

## 2014-05-20 NOTE — Patient Instructions (Signed)

## 2014-05-20 NOTE — Progress Notes (Signed)
Subjective:    Patient ID: [name], male    DOB: 11/22/1941, 73 y.o.   MRN: [id number]  HPI  He describes "feeling washed out" and tired for 6 months. He's had occasional headaches and intermittent chest tightness during that period. The chest tightness is unrelated to activity. He feels as if he may be "missing a beat" at times .With the chest tightness there is no associated lightheadedness or dizziness.  Blood pressure has been measured irregularly. It has always been below 140/90.  He stopped his pravastatin 3 months ago because of arthralgias. These have not recurred since. At this time he is only on aspirin.  After his father died he was depressed; he questions similar symptoms at this time. He is inquiring as to possibly taking medication for that.      Review of Systems  He has no fever, chills, sweats, weight loss  Has no melena, rectal bleeding, or any abdominal pain.  He has no peripheral edema or paroxysmal nocturnal dyspnea.  There are no bowel changes constipation or diarrhea  He does not notice any swelling or redness of the joints.  He's had no blurred vision, double vision, loss of vision  A recent ophthalmologic exam was normal.  He has no associated shortness of breath.  He's had some pain in the tops of his feet after he been seated for an hour or more. There is no associated tingling or numbness.       Objective:   Physical Exam   Significant or distinguishing  findings on physical exam are documented first.  Below that are other systems examined & findings. Heart sounds are somewhat distant. He is a grade 1/2 systolic murmur. There is crepitus of the knees without associated effusion. There is some decreased range of motion of the knees.  Gen.: Healthy and well-nourished in appearance. Alert, appropriate and cooperative throughout exam. Appears younger than stated age  Head: Normocephalic without obvious abnormalities;  pattern alopecia    Eyes: No corneal or conjunctival inflammation noted. Pupils equal round reactive to light and accommodation. Extraocular motion intact. Ears: External  ear exam reveals no significant lesions or deformities. Canals clear .TMs normal. Nose: External nasal exam reveals no deformity or inflammation. Nasal mucosa are pink and moist. No lesions or exudates noted.   Mouth: Oral mucosa and oropharynx reveal no lesions or exudates.   Neck: No deformities, masses, or tenderness noted. Range of motion &. Thyroid normal. Lungs: Normal respiratory effort; chest expands symmetrically. Lungs are clear to auscultation without rales, wheezes, or increased work of breathing. Heart: Normal rate and rhythm. Normal S1 and S2. No gallop, click, or rub. Abdomen: Bowel sounds normal; abdomen soft and nontender. No masses, organomegaly or hernias noted.                                 Musculoskeletal/extremities: No deformity or scoliosis noted of  the thoracic or lumbar spine.   No clubbing, cyanosis, edema, or significant extremity  deformity noted. Tone & strength normal. Hand joints normal  Fingernail health good. Able to lie down & sit up w/o help. Negative SLR bilaterally Vascular: Carotid, radial artery, dorsalis pedis and  posterior tibial pulses are full and equal. No bruits present. Neurologic: Alert and oriented x3. Deep tendon reflexes symmetrical and normal.  Gait normal         Skin: Intact without suspicious lesions or rashes. Lymph: No  cervical, axillary lymphadenopathy present. Psych: Mood and affect are normal w/o obvious depression. Normally interactive                                                                                       Assessment & Plan:  #1 fatigue x6 months  #2 atypical chest tightness  #3 possible depression  See orders

## 2014-05-20 NOTE — Progress Notes (Signed)
Pre visit review using our clinic review tool, if applicable. No additional management support is needed unless otherwise documented below in the visit note. 

## 2014-05-21 ENCOUNTER — Telehealth: Payer: Self-pay | Admitting: Internal Medicine

## 2014-05-21 NOTE — Telephone Encounter (Signed)
Relevant patient education assigned to patient using Emmi. ° °

## 2014-05-25 ENCOUNTER — Telehealth: Payer: Self-pay

## 2014-05-25 NOTE — Telephone Encounter (Signed)
Pt called in for lab results, pt notified and results will be mailed to pt

## 2014-08-17 ENCOUNTER — Ambulatory Visit (INDEPENDENT_AMBULATORY_CARE_PROVIDER_SITE_OTHER): Payer: Medicare Other | Admitting: Pulmonary Disease

## 2014-08-17 ENCOUNTER — Encounter: Payer: Self-pay | Admitting: Pulmonary Disease

## 2014-08-17 VITALS — BP 120/78 | HR 54 | Temp 98.0°F | Ht 72.0 in | Wt 192.8 lb

## 2014-08-17 DIAGNOSIS — G4733 Obstructive sleep apnea (adult) (pediatric): Secondary | ICD-10-CM

## 2014-08-17 NOTE — Patient Instructions (Signed)
Continue on cpap, and keep up with mask changes and supplies. Work on modest weight loss followup with me again in one year.

## 2014-08-17 NOTE — Progress Notes (Signed)
   Subjective:    Patient ID: [name], male    DOB: 20-Feb-1941, 73 y.o.   MRN: [id number]  HPI The patient comes in today for followup of his known obstructive sleep apnea. He is wearing CPAP compliantly, and is having no issues with his mask fit or pressure. He is sleeping well with the device, and has excellent daytime alertness. The patient is inquiring about travel CPAP that he may wish to purchase. It should be noted the patient's weight has decreased 10 pounds since the last visit.   Review of Systems  Constitutional: Negative for fever and unexpected weight change.  HENT: Negative for congestion, dental problem, ear pain, nosebleeds, postnasal drip, rhinorrhea, sinus pressure, sneezing, sore throat and trouble swallowing.   Eyes: Negative for redness and itching.  Respiratory: Negative for cough, chest tightness, shortness of breath and wheezing.   Cardiovascular: Negative for palpitations and leg swelling.  Gastrointestinal: Negative for nausea and vomiting.  Genitourinary: Negative for dysuria.  Musculoskeletal: Negative for joint swelling.  Skin: Negative for rash.  Neurological: Negative for headaches.  Hematological: Does not bruise/bleed easily.  Psychiatric/Behavioral: Negative for dysphoric mood. The patient is not nervous/anxious.        Objective:   Physical Exam Well-developed male in no acute distress Nose without purulence or discharge noted No skin breakdown or pressure necrosis from the CPAP mask Neck without lymphadenopathy or thyromegaly Lower extremities without edema, no cyanosis Alert and oriented, moves all 4 extremities.       Assessment & Plan:

## 2014-08-17 NOTE — Assessment & Plan Note (Signed)
The patient is doing very well with his CPAP device, and is satisfied with his sleep and daytime alertness. I've asked him to continue on CPAP, and to keep up with his mask changes and supplies. I've also encouraged him to continue working on modest weight loss.

## 2014-10-22 ENCOUNTER — Telehealth: Payer: Self-pay | Admitting: *Deleted

## 2014-10-22 NOTE — Telephone Encounter (Signed)
Received medical record request via fax from [facility]. Form forwarded to health port. JG//[id number]

## 2014-12-23 ENCOUNTER — Encounter: Payer: Self-pay | Admitting: Pulmonary Disease

## 2015-05-27 ENCOUNTER — Telehealth: Payer: Self-pay | Admitting: Emergency Medicine

## 2015-05-27 NOTE — Telephone Encounter (Signed)
He has moved This needs to come from his local PCP

## 2015-05-27 NOTE — Telephone Encounter (Signed)
Pharm sent in refill request for Prozac, last OV 06/15. Please advise

## 2015-08-18 ENCOUNTER — Ambulatory Visit: Payer: Medicare Other | Admitting: Pulmonary Disease

## 2017-11-13 ENCOUNTER — Encounter: Payer: Self-pay | Admitting: Internal Medicine
